# Patient Record
Sex: Female | Born: 1960 | Race: White | Hispanic: No | Marital: Married | Smoking: Never smoker
Health system: Southern US, Community
[De-identification: ages and names within clinical notes are randomized; demographics above are authoritative.]

## PROBLEM LIST (undated history)

## (undated) DIAGNOSIS — R921 Mammographic calcification found on diagnostic imaging of breast: Secondary | ICD-10-CM

## (undated) DIAGNOSIS — J309 Allergic rhinitis, unspecified: Secondary | ICD-10-CM

## (undated) DIAGNOSIS — I10 Essential (primary) hypertension: Secondary | ICD-10-CM

## (undated) DIAGNOSIS — B019 Varicella without complication: Secondary | ICD-10-CM

## (undated) DIAGNOSIS — K219 Gastro-esophageal reflux disease without esophagitis: Secondary | ICD-10-CM

## (undated) DIAGNOSIS — T7840XA Allergy, unspecified, initial encounter: Secondary | ICD-10-CM

## (undated) DIAGNOSIS — U071 COVID-19: Secondary | ICD-10-CM

## (undated) HISTORY — PX: OOPHORECTOMY: SHX86

## (undated) HISTORY — DX: Allergic rhinitis, unspecified: J30.9

## (undated) HISTORY — DX: Essential (primary) hypertension: I10

## (undated) HISTORY — DX: Gastro-esophageal reflux disease without esophagitis: K21.9

## (undated) HISTORY — DX: COVID-19: U07.1

## (undated) HISTORY — DX: Varicella without complication: B01.9

## (undated) HISTORY — DX: Mammographic calcification found on diagnostic imaging of breast: R92.1

## (undated) HISTORY — PX: TONSILLECTOMY: SUR1361

## (undated) HISTORY — DX: Allergy, unspecified, initial encounter: T78.40XA

---

## 2005-04-28 ENCOUNTER — Ambulatory Visit: Payer: Self-pay | Admitting: Family Medicine

## 2005-07-23 ENCOUNTER — Ambulatory Visit: Payer: Self-pay | Admitting: Unknown Physician Specialty

## 2005-08-07 ENCOUNTER — Ambulatory Visit: Payer: Self-pay | Admitting: Unknown Physician Specialty

## 2007-04-21 ENCOUNTER — Ambulatory Visit: Payer: Self-pay | Admitting: Unknown Physician Specialty

## 2007-08-04 HISTORY — PX: ABDOMINAL HYSTERECTOMY: SHX81

## 2007-08-29 ENCOUNTER — Ambulatory Visit: Payer: Self-pay | Admitting: Family Medicine

## 2007-09-06 ENCOUNTER — Ambulatory Visit: Payer: Self-pay | Admitting: Gynecologic Oncology

## 2007-10-06 ENCOUNTER — Other Ambulatory Visit: Payer: Self-pay

## 2007-10-06 ENCOUNTER — Ambulatory Visit: Payer: Self-pay | Admitting: Unknown Physician Specialty

## 2007-10-11 ENCOUNTER — Inpatient Hospital Stay: Payer: Self-pay | Admitting: Unknown Physician Specialty

## 2009-02-28 ENCOUNTER — Ambulatory Visit: Payer: Self-pay | Admitting: Unknown Physician Specialty

## 2009-11-18 ENCOUNTER — Ambulatory Visit: Payer: Self-pay | Admitting: Family Medicine

## 2010-10-15 ENCOUNTER — Ambulatory Visit: Payer: Self-pay | Admitting: Family Medicine

## 2011-11-09 IMAGING — CT CT MAXILLOFACIAL WITH CONTRAST
1 series · 15 of 30 positions shown, 19 images · non-contrast
Comparison: none

REASON FOR EXAM: FACIAL SWELLING R PROTID GLAND SWOLLEN
COMMENTS:

PROCEDURE:     CT  - CT MAXILLOFACIAL AREA W  - November 18, 2009  [DATE]
RESULT:
TECHNIQUE: Helical 3 mm sections were obtained status post intravenous
administration of 75 mL [id number]. These were reconstructed utilizing a
soft tissue algorithm.

[Series 2: soft tissue · axial · 0.41mm/px · z∈[+232,+392]mm · 15 of 57 slices shown, 19 images]
[im 2/57  brain]
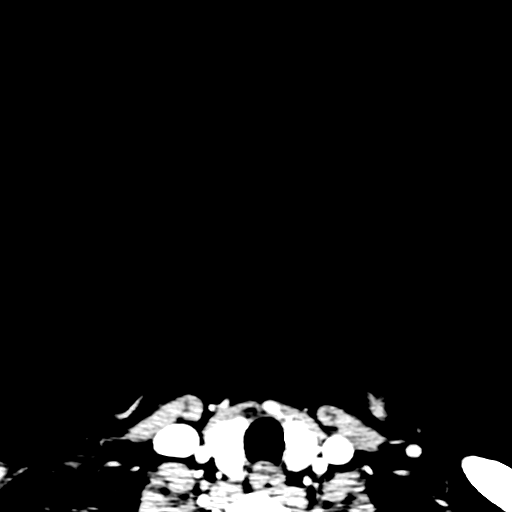
[im 2/57  bone]
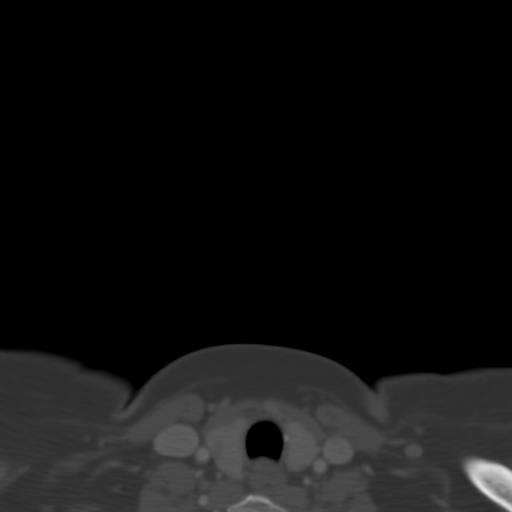
[im 6/57  bone]
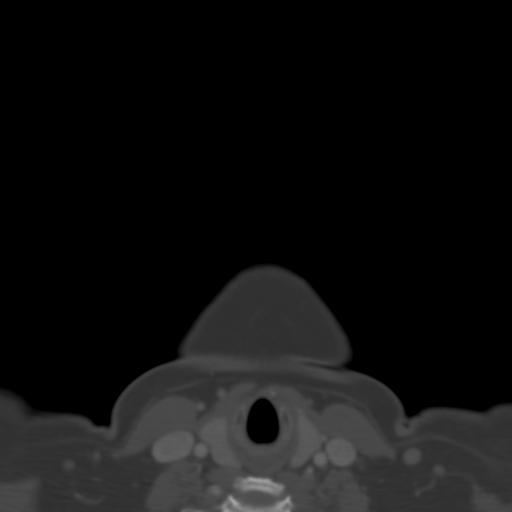
[im 10/57  bone]
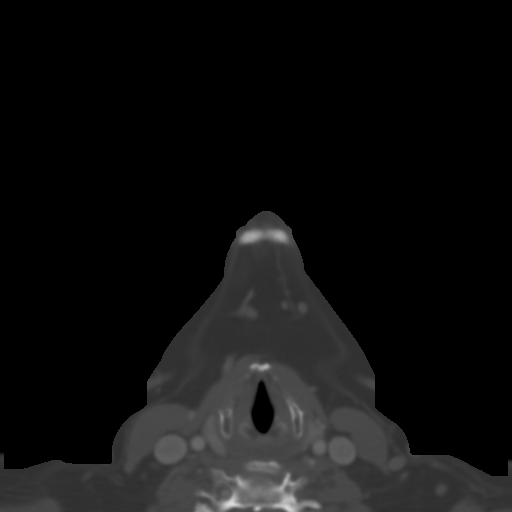
[im 14/57  bone]
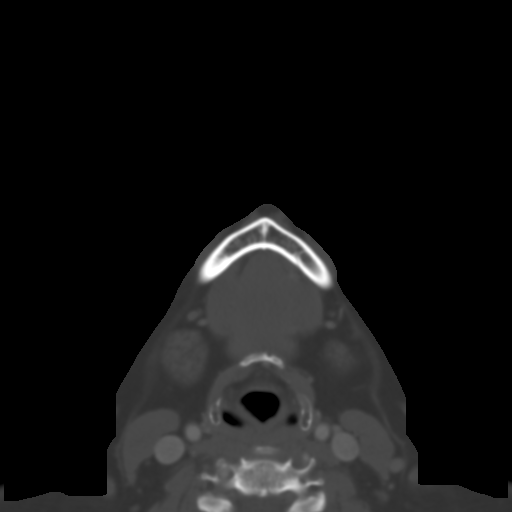
[im 18/57  brain]
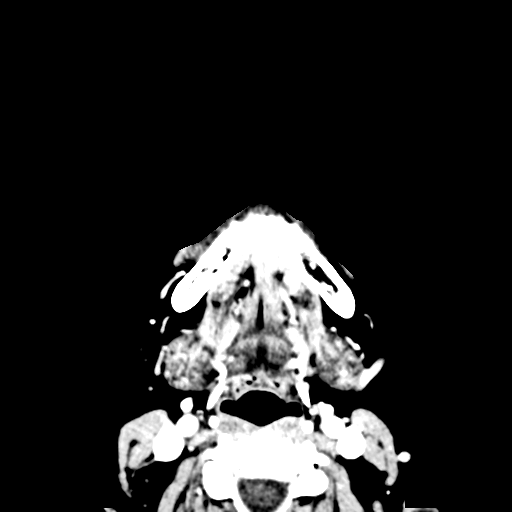
[im 18/57  bone]
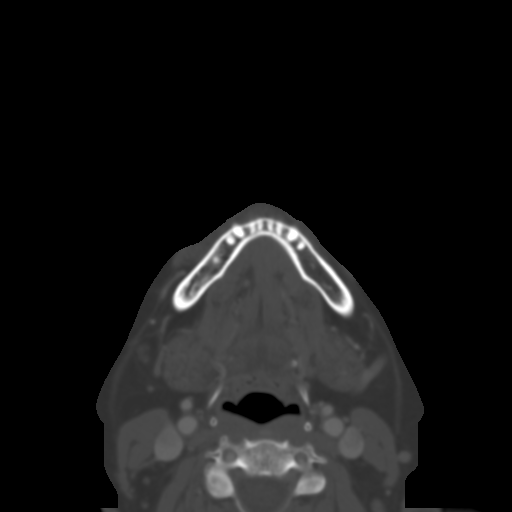
[im 20/57  bone]
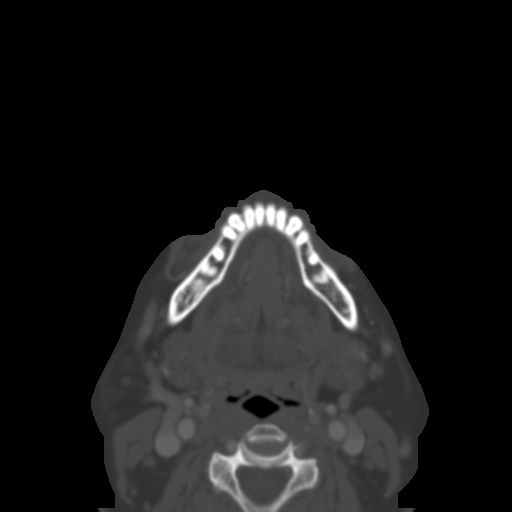
[im 24/57  bone]
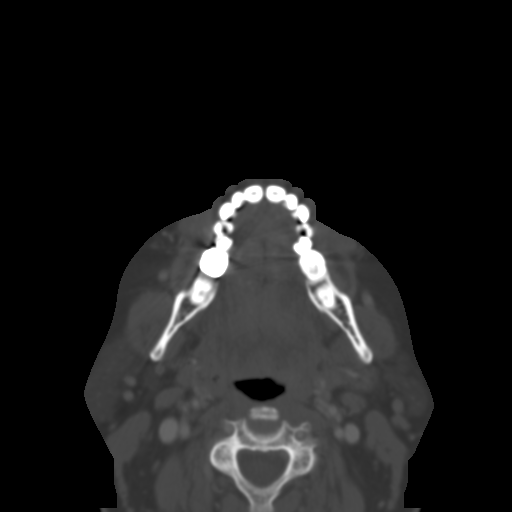
[im 29/57  bone]
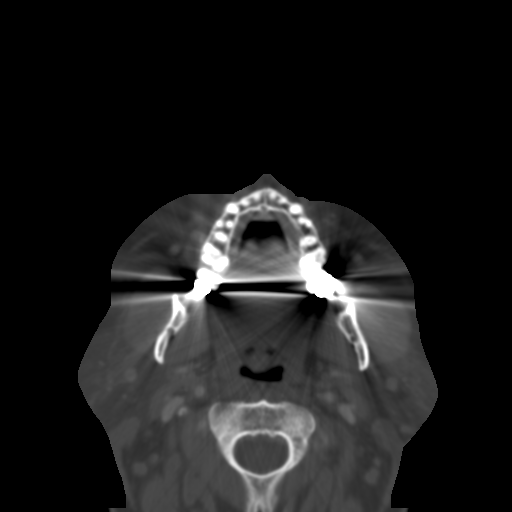
[im 33/57  brain]
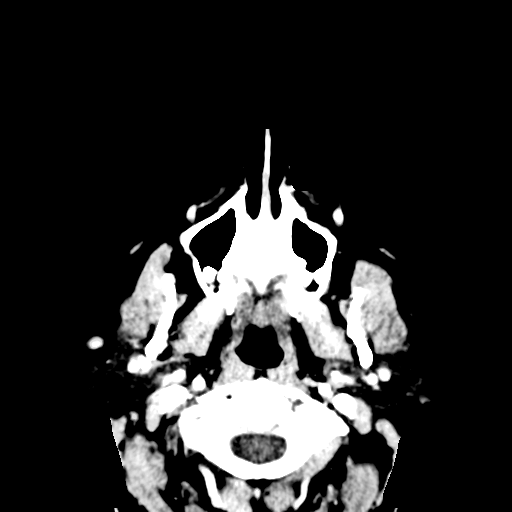
[im 33/57  bone]
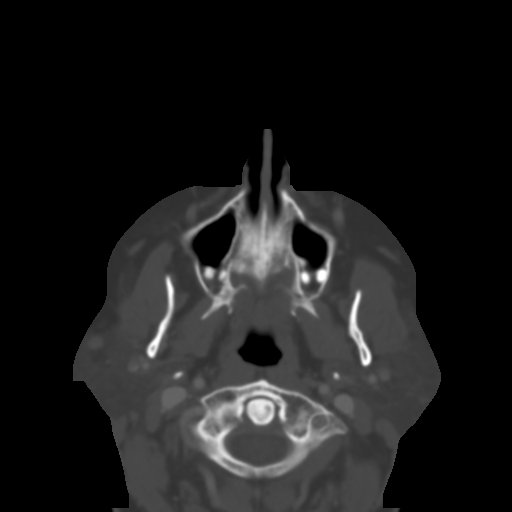
[im 37/57  bone]
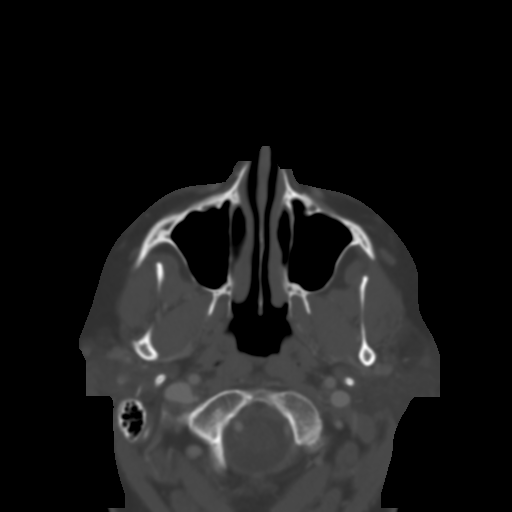
[im 39/57  bone]
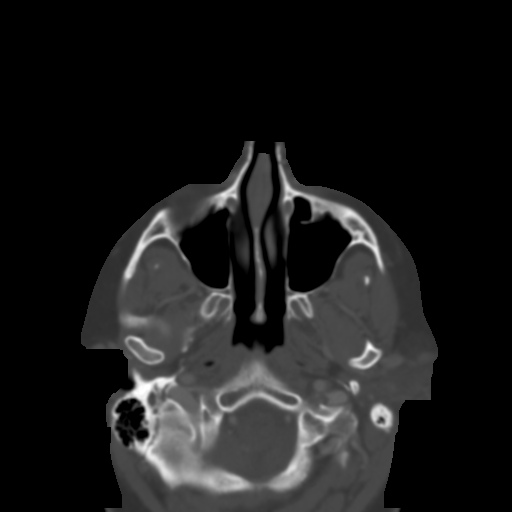
[im 43/57  bone]
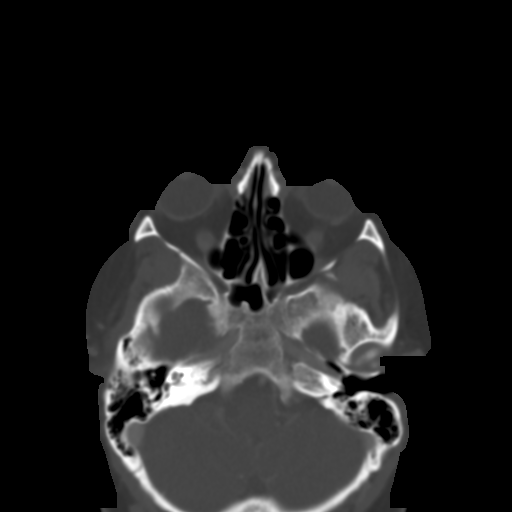
[im 47/57  brain]
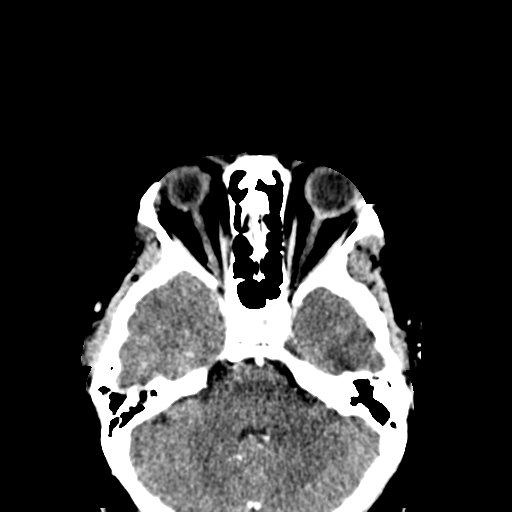
[im 47/57  bone]
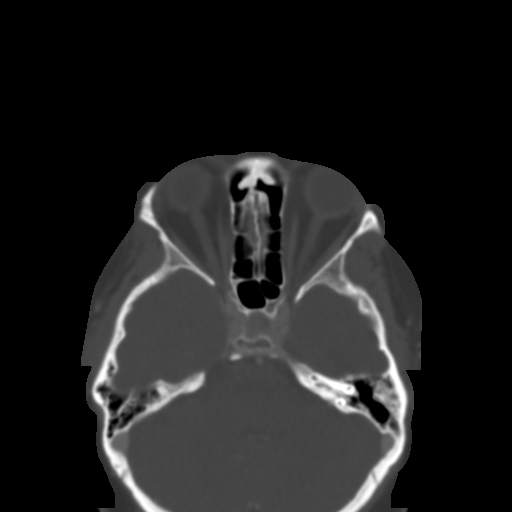
[im 51/57  bone]
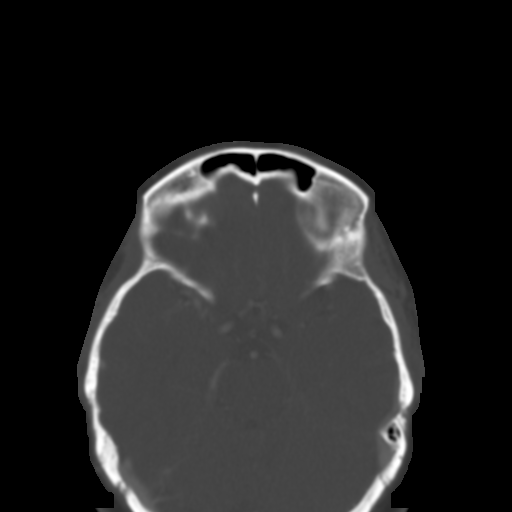
[im 55/57  bone]
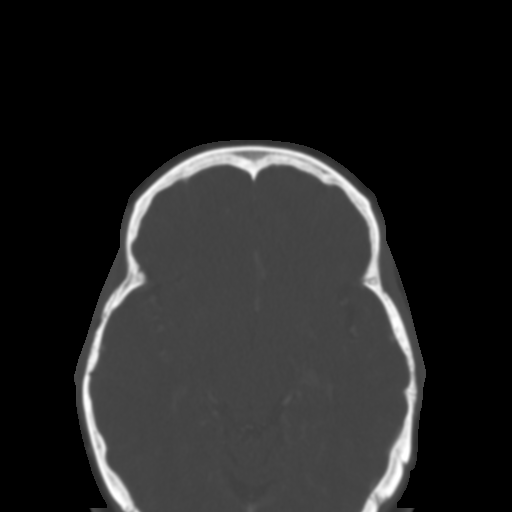

[15 of 30 positions shown; findings below may reference images not displayed]

FINDINGS: Evaluation of the right parotid in the region of palpable concern
demonstrates a small 6.4 cm enhancing nodule along the serosal surface. This
is an indeterminate appearing nodule. This is in the region of palpable
concern. A second nodule is identified along the anterior apex of the
parotid on the right measuring 7.1 millimeters. There is a hint of a
dicrotic notch within this nodule. No further masses or nodule is
identified. There is no evidence of free fluid or loculated fluid
collections. Opacified vascular structures are unremarkable. The paranasal
sinuses are patent. The skull base is unremarkable.
IMPRESSION: Very small indeterminate nodule in the region of palpable concern and a
second nodule along the anterior/superior border of the parotid. Further
evaluation with ENT consultation is recommended. Differential considerations
are parotid soft tissue nodules versus small parotid lymph nodes. The
benignity of these nodules cannot be determined from this study.

Thank you for the opportunity to contribute to the care of your patient.

ADDENDUM:  12/04/09- First sentence in Findings should read: Evaluation of the
right parotid in the region of palpable concern demonstrates a small 6.4 mm
enhancing nodule along the serosal surface.

## 2012-10-05 IMAGING — MG MAM DGTL SCREENING MAMMO W/CAD
1 series · 4 of 4 positions shown · non-contrast
Comparison: none

REASON FOR EXAM: SCREENING
COMMENTS:  Submitted by practice: [facility] Scheduled by user:
Misghina Azarcon

[R CC · right · 4 of 4 slices shown]
[im 1/4]
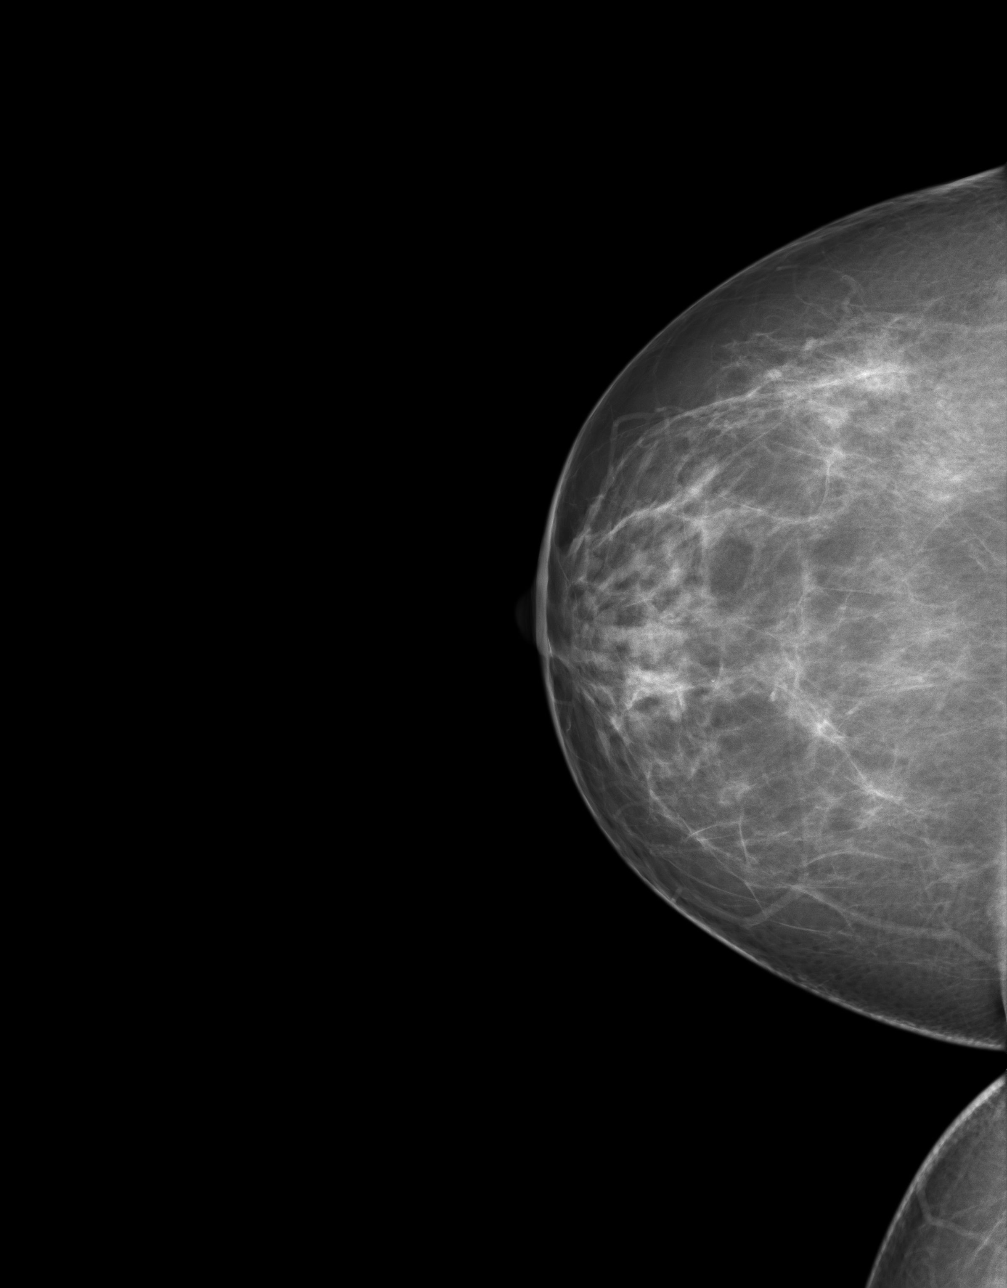
[im 2/4]
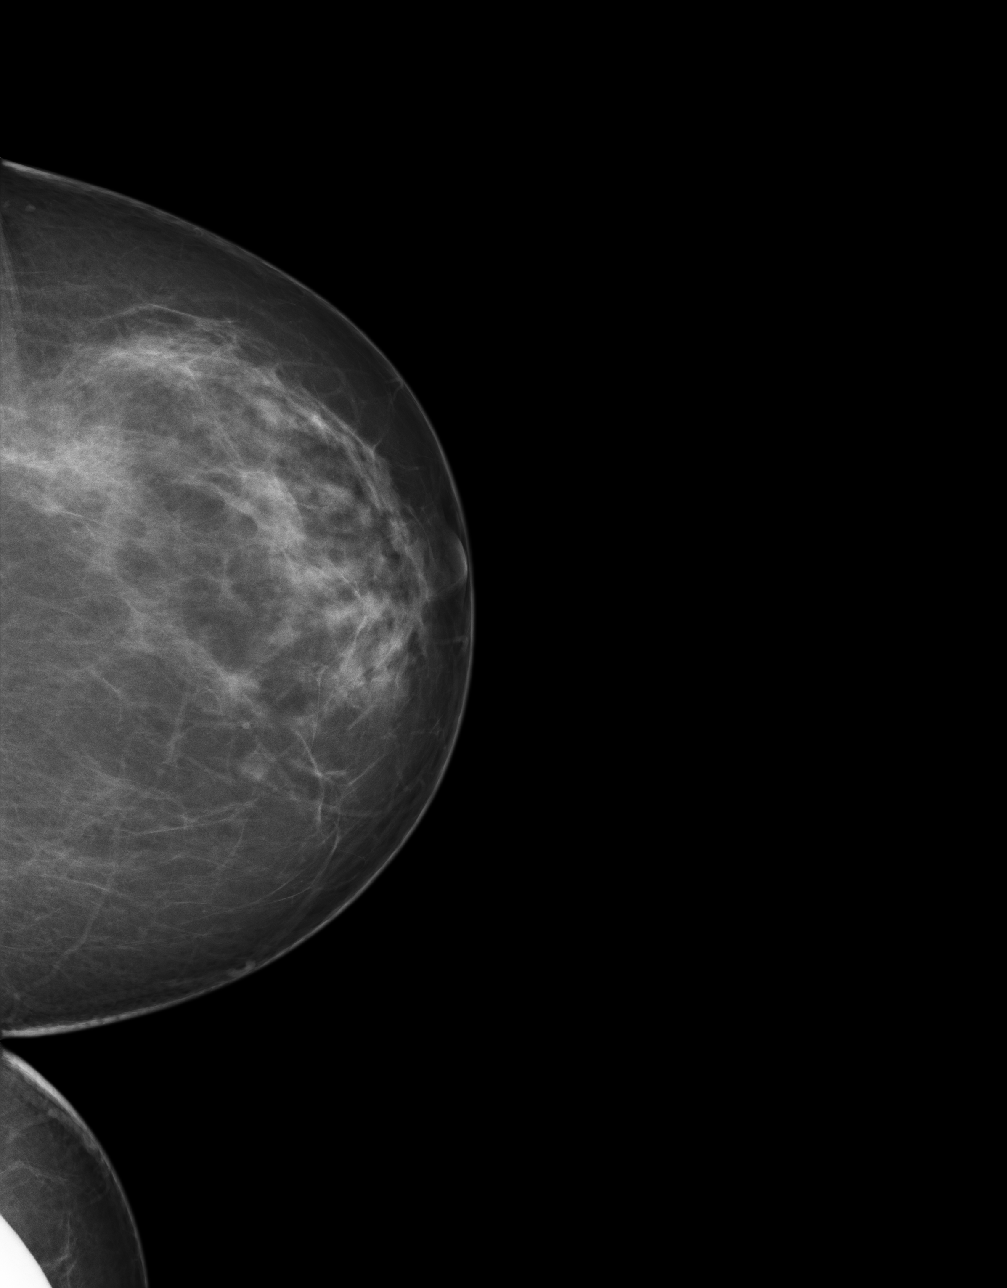
[im 3/4]
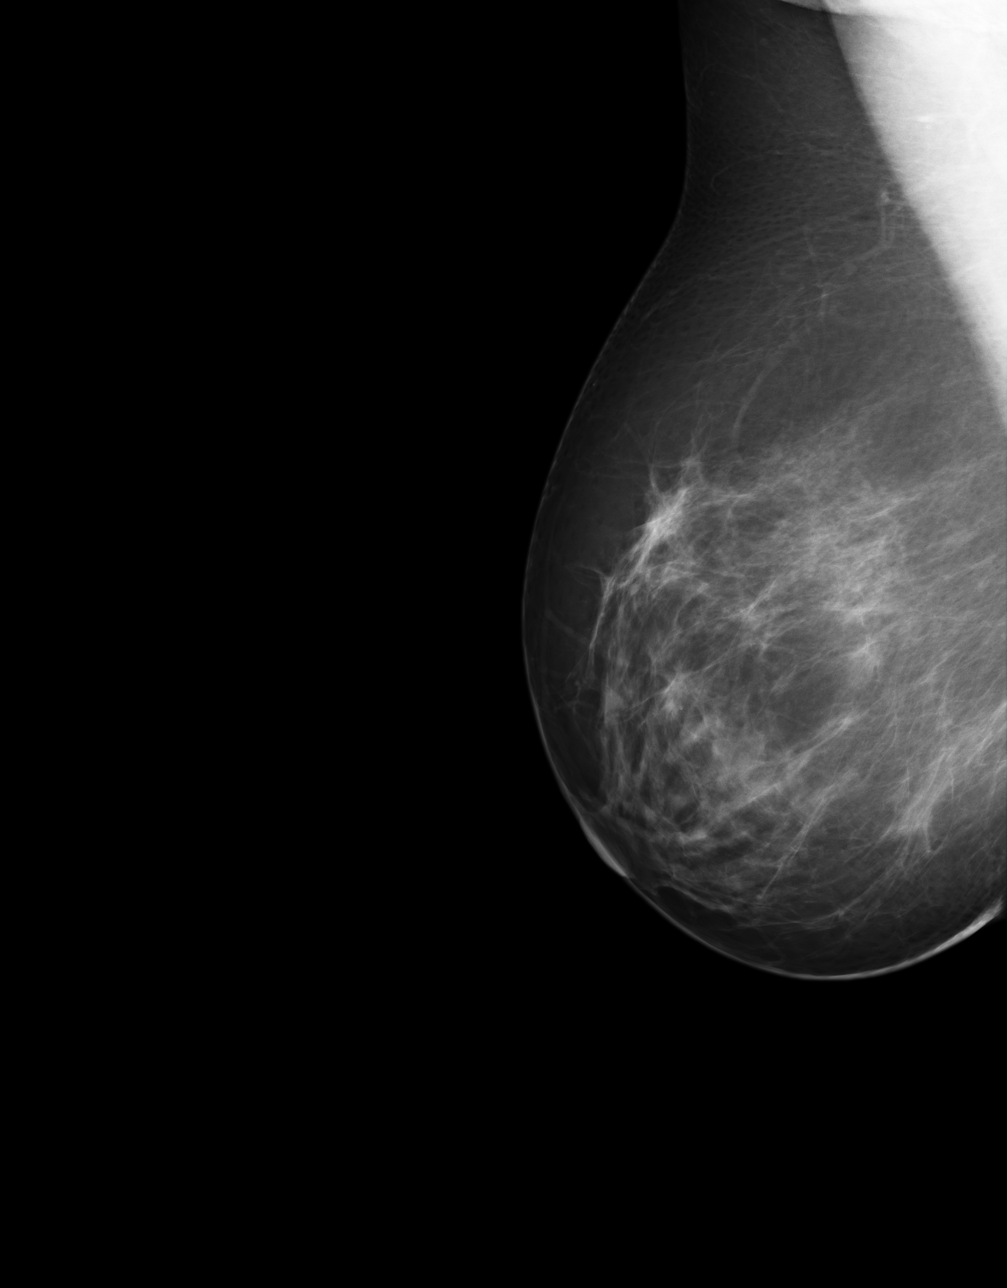
[im 4/4]
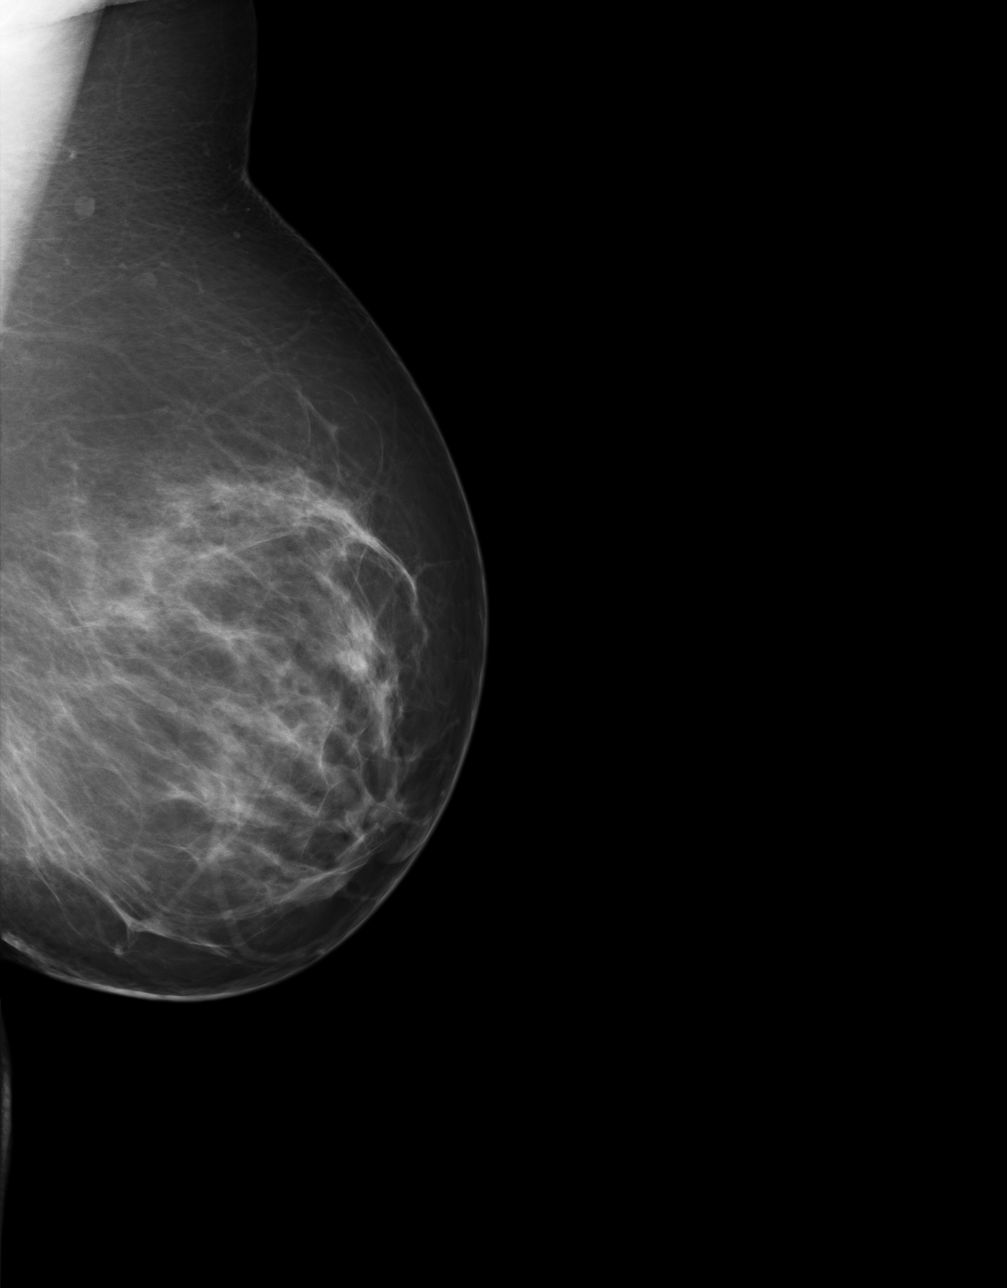

[4 of 4 positions shown; findings below may reference images not displayed]

PROCEDURE:     MAM - MAM DGTL SCREENING MAMMO W/CAD  - October 15, 2010  [DATE]

RESULT:     Comparison is made to prior examinations February 28, 2009,April 21, 2007 and June 20, 2002. There are scattered fibroglandular
densities bilaterally. No dominant mass or malignant-appearing
microcalcifications are seen.
IMPRESSION: 1. Bilaterally benign-appearing screening mammography.
2. Annual screening mammography is recommended.
3. BI-RADS: Category 1-Negative.

A negative mammogram report does not preclude biopsy or other evaluation of
a clinically palpable or otherwise suspicious mass or lesion.  Breast cancer
may not be detected by mammography in up to 10% of cases.

## 2012-11-16 ENCOUNTER — Ambulatory Visit: Payer: Self-pay | Admitting: Family Medicine

## 2014-10-13 LAB — LIPID PANEL
Cholesterol: 165 mg/dL (ref 0–200)
HDL: 58 mg/dL (ref 35–70)
LDL Cholesterol: 93 mg/dL
TRIGLYCERIDES: 70 mg/dL (ref 40–160)

## 2014-10-13 LAB — BASIC METABOLIC PANEL
BUN: 11 mg/dL (ref 4–21)
Creatinine: 0.9 mg/dL (ref 0.5–1.1)
GLUCOSE: 90 mg/dL
POTASSIUM: 4.5 mmol/L (ref 3.4–5.3)
SODIUM: 140 mmol/L (ref 137–147)

## 2014-10-13 LAB — TSH: TSH: 3.76 u[IU]/mL (ref 0.41–5.90)

## 2014-10-13 LAB — CBC AND DIFFERENTIAL
HEMATOCRIT: 42 % (ref 36–46)
HEMOGLOBIN: 14 g/dL (ref 12.0–16.0)
NEUTROS ABS: 2 /uL
Platelets: 204 10*3/uL (ref 150–399)
WBC: 4.1 10^3/mL

## 2014-10-13 LAB — HEPATIC FUNCTION PANEL
ALK PHOS: 84 U/L (ref 25–125)
ALT: 19 U/L (ref 7–35)
AST: 27 U/L (ref 13–35)
Bilirubin, Total: 0.5 mg/dL

## 2014-11-07 IMAGING — MG MAM DGTL SCRN MAM NO ORDER W/CAD
1 series · 5 of 5 positions shown · non-contrast
Comparison: none

REASON FOR EXAM: SCR MAMMO NO ORDER
COMMENTS:

[R CC · right · 5 of 5 slices shown]
[im 1/5]
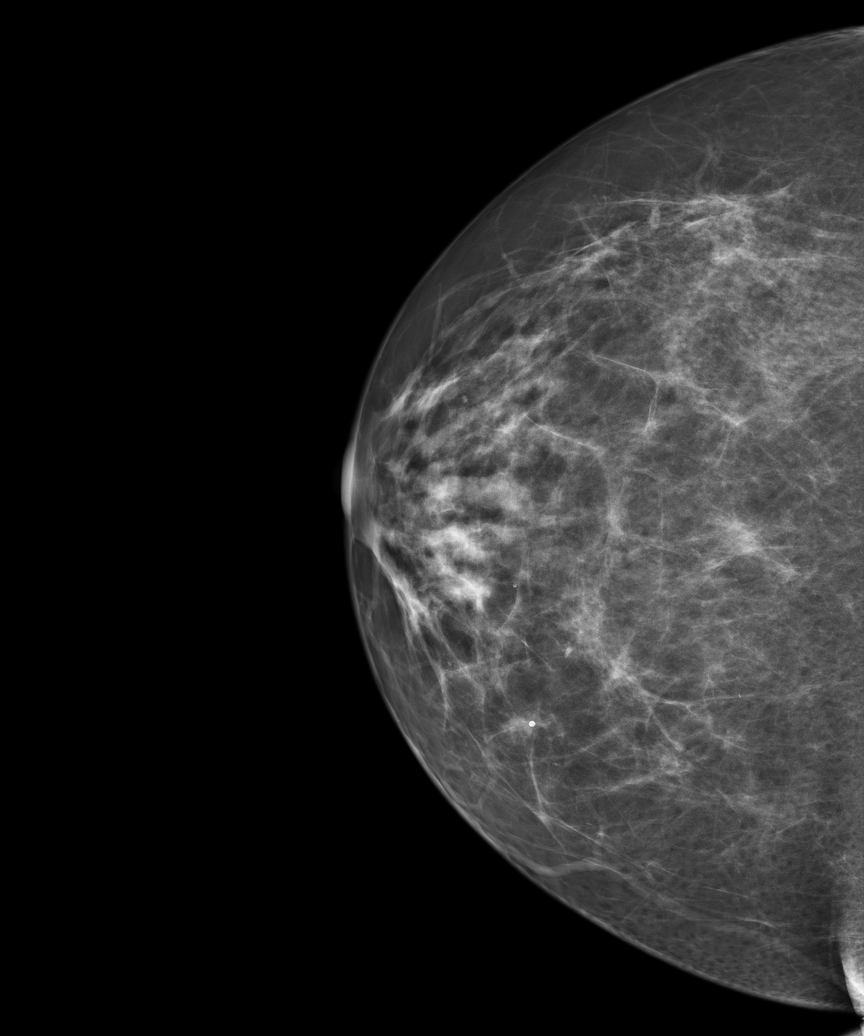
[im 2/5]
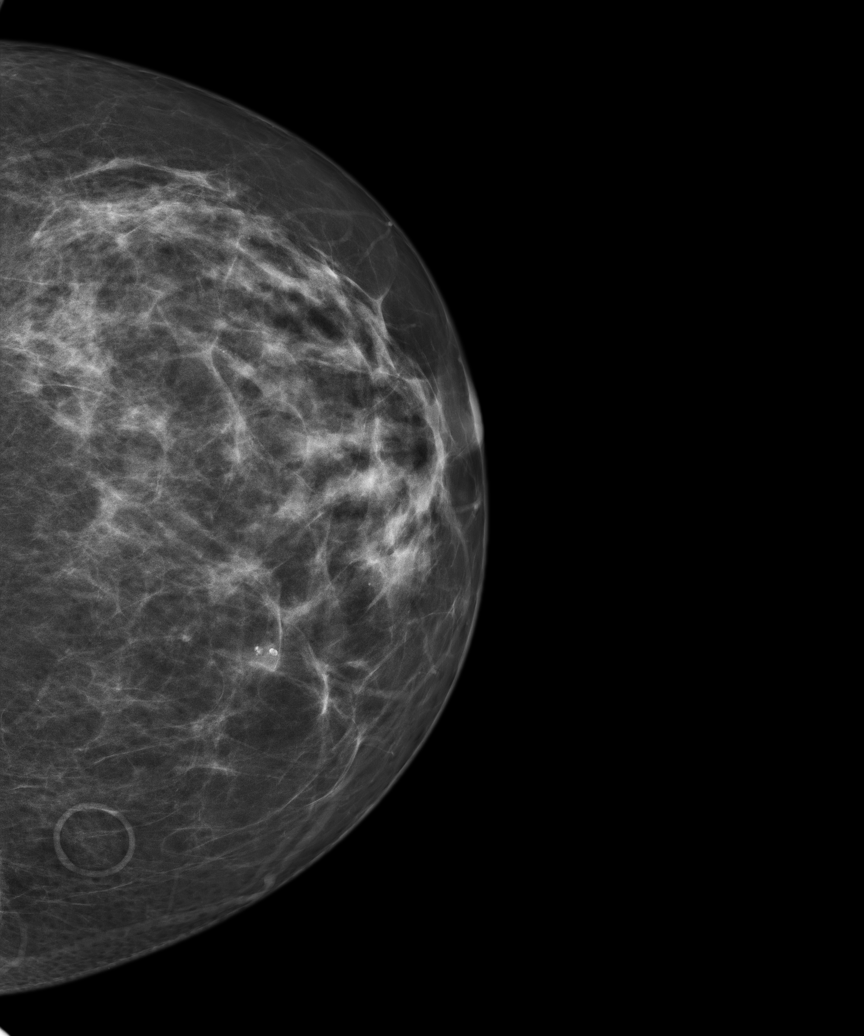
[im 3/5]
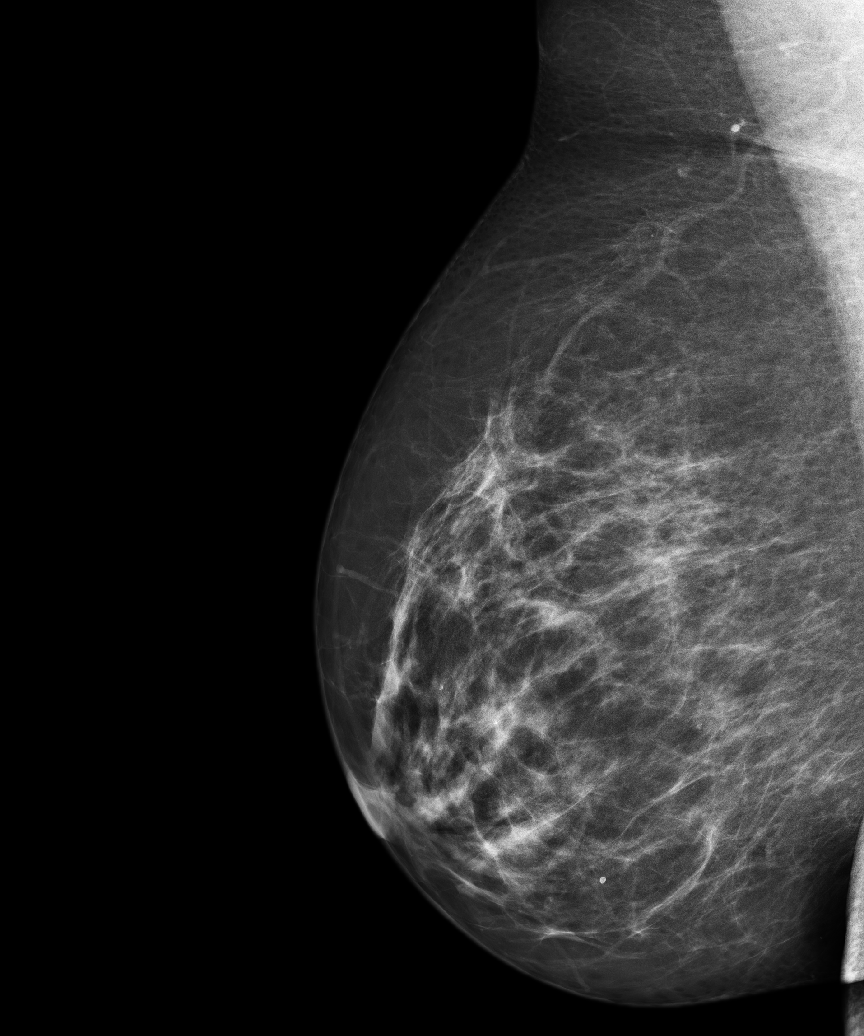
[im 4/5]
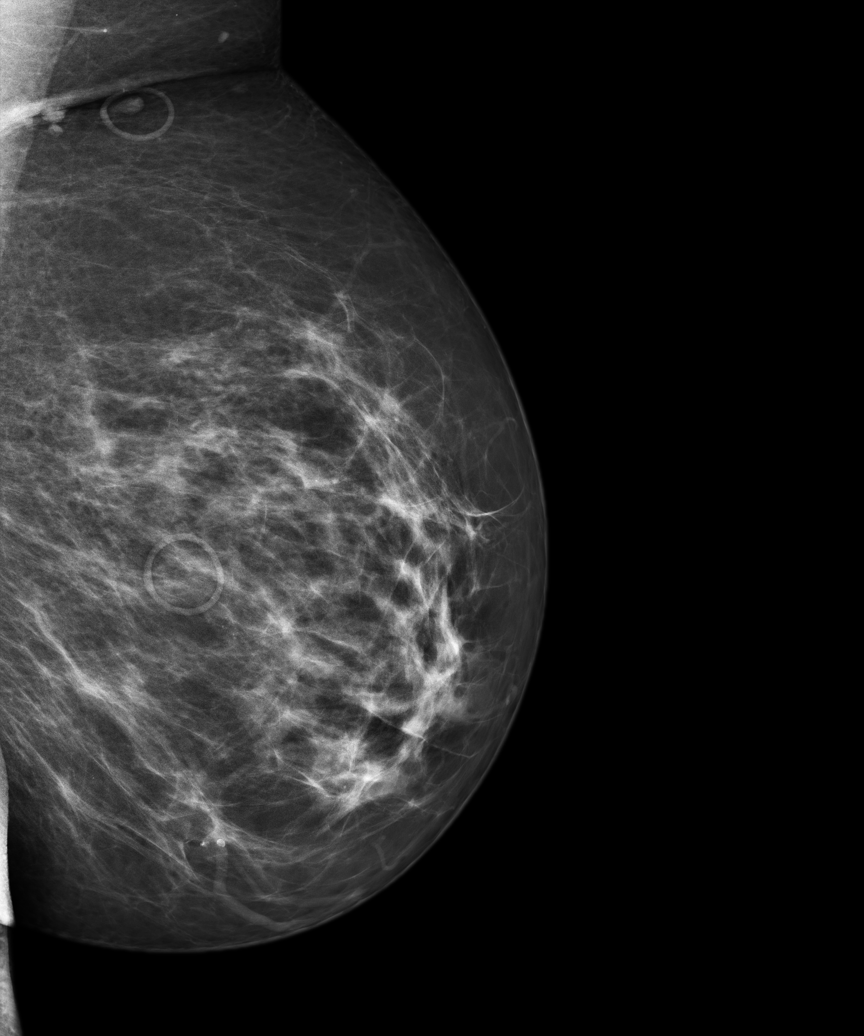
[im 5/5]
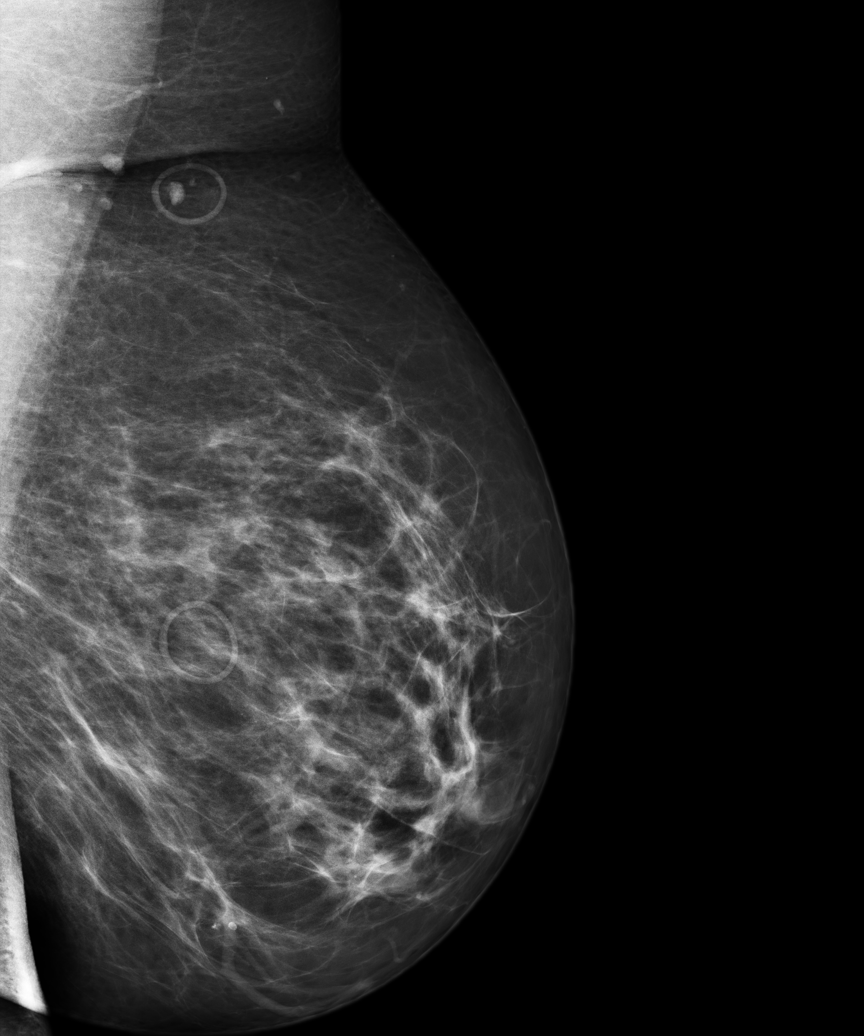

[5 of 5 positions shown; findings below may reference images not displayed]

PROCEDURE:     MAM - MAM DGTL SCRN MAM NO ORDER W/CAD  - November 16, 2012  [DATE]

RESULT:     There is no personal or family history of breast cancer. The
patient denies previous breast surgery. Comparison is made to the digital
images dated [DATE] [DATE] [DATE], [DATE] [DATE] [DATE] and 17 July, 2003. There is a
heterogeneous pattern of parenchymal density with no developing or dominant
mass or malignant calcification. Skin nevus markers are present over the
left breast. The mammographic appearance is stable.
IMPRESSION: Stable, benign appearing bilateral mammogram. Please
continue to encourage annual mammographic followup and monthly breast self
exam. BREAST COMPOSITION: The breast composition is HETEROGENEOUSLY DENSE
(glandular tissue is 51-75%) This may decrease the sensitivity of
mammography.  BI-RADS: Category 2- Benign Finding

A NEGATIVE MAMMOGRAM REPORT DOES NOT PRECLUDE BIOPSY OR OTHER EVALUATION OF
A CLINICALLY PALPABLE OR OTHERWISE SUSPICIOUS MASS OR LESION. BREAST CANCER
MAY NOT BE DETECTED IN UP TO 10% OF CASES.

[REDACTED]

## 2015-10-21 ENCOUNTER — Encounter: Payer: Self-pay | Admitting: Family Medicine

## 2015-10-21 ENCOUNTER — Ambulatory Visit (INDEPENDENT_AMBULATORY_CARE_PROVIDER_SITE_OTHER): Payer: BLUE CROSS/BLUE SHIELD | Admitting: Family Medicine

## 2015-10-21 VITALS — BP 146/82 | HR 72 | Temp 98.0°F | Ht 63.0 in | Wt 188.5 lb

## 2015-10-21 DIAGNOSIS — Z130 Encounter for screening for diseases of the blood and blood-forming organs and certain disorders involving the immune mechanism: Secondary | ICD-10-CM

## 2015-10-21 DIAGNOSIS — Z1159 Encounter for screening for other viral diseases: Secondary | ICD-10-CM

## 2015-10-21 DIAGNOSIS — I10 Essential (primary) hypertension: Secondary | ICD-10-CM | POA: Diagnosis not present

## 2015-10-21 DIAGNOSIS — E669 Obesity, unspecified: Secondary | ICD-10-CM

## 2015-10-21 DIAGNOSIS — Z0000 Encounter for general adult medical examination without abnormal findings: Secondary | ICD-10-CM | POA: Insufficient documentation

## 2015-10-21 DIAGNOSIS — Z1211 Encounter for screening for malignant neoplasm of colon: Secondary | ICD-10-CM

## 2015-10-21 DIAGNOSIS — Z13220 Encounter for screening for lipoid disorders: Secondary | ICD-10-CM

## 2015-10-21 LAB — LIPID PANEL
CHOL/HDL RATIO: 3
Cholesterol: 161 mg/dL (ref 0–200)
HDL: 47.8 mg/dL (ref >39.00)
LDL Cholesterol: 84 mg/dL (ref 0–99)
NONHDL: 112.93
Triglycerides: 144 mg/dL (ref 0.0–149.0)
VLDL: 28.8 mg/dL (ref 0.0–40.0)

## 2015-10-21 LAB — COMPREHENSIVE METABOLIC PANEL
ALK PHOS: 69 U/L (ref 39–117)
ALT: 19 U/L (ref 0–35)
AST: 21 U/L (ref 0–37)
Albumin: 4.1 g/dL (ref 3.5–5.2)
BILIRUBIN TOTAL: 0.4 mg/dL (ref 0.2–1.2)
BUN: 17 mg/dL (ref 6–23)
CO2: 26 meq/L (ref 19–32)
CREATININE: 0.84 mg/dL (ref 0.40–1.20)
Calcium: 9.8 mg/dL (ref 8.4–10.5)
Chloride: 104 mEq/L (ref 96–112)
GFR: 74.55 mL/min (ref >60.00)
GLUCOSE: 93 mg/dL (ref 70–99)
Potassium: 3.9 mEq/L (ref 3.5–5.1)
Sodium: 138 mEq/L (ref 135–145)
TOTAL PROTEIN: 7.3 g/dL (ref 6.0–8.3)

## 2015-10-21 LAB — CBC
HCT: 41 % (ref 36.0–46.0)
HEMOGLOBIN: 13.8 g/dL (ref 12.0–15.0)
MCHC: 33.7 g/dL (ref 30.0–36.0)
MCV: 87.2 fl (ref 78.0–100.0)
PLATELETS: 221 10*3/uL (ref 150.0–400.0)
RBC: 4.71 Mil/uL (ref 3.87–5.11)
RDW: 12.8 % (ref 11.5–15.5)
WBC: 5.7 10*3/uL (ref 4.0–10.5)

## 2015-10-21 LAB — HEMOGLOBIN A1C: HEMOGLOBIN A1C: 5.5 % (ref 4.6–6.5)

## 2015-10-21 MED ORDER — LOSARTAN POTASSIUM 50 MG PO TABS: 50.0000 mg | ORAL_TABLET | Freq: Every day | ORAL | Status: DC

## 2015-10-21 NOTE — Patient Instructions (Signed)
I have refilled your BP.  Follow up in 6 months to 1 year.  Be sure to get your mammogram.  We we'll be in contact regarding colonoscopy.  Take care  Dr. Lacinda Axon  Health Maintenance, Female Adopting a healthy lifestyle and getting preventive care can go a long way to promote health and wellness. Talk with your health care provider about what schedule of regular examinations is right for you. This is a good chance for you to check in with your provider about disease prevention and staying healthy. In between checkups, there are plenty of things you can do on your own. Experts have done a lot of research about which lifestyle changes and preventive measures are most likely to keep you healthy. Ask your health care provider for more information. WEIGHT AND DIET  Eat a healthy diet  Be sure to include plenty of vegetables, fruits, low-fat dairy products, and lean protein.  Do not eat a lot of foods high in solid fats, added sugars, or salt.  Get regular exercise. This is one of the most important things you can do for your health.  Most adults should exercise for at least 150 minutes each week. The exercise should increase your heart rate and make you sweat (moderate-intensity exercise).  Most adults should also do strengthening exercises at least twice a week. This is in addition to the moderate-intensity exercise.  Maintain a healthy weight  Body mass index (BMI) is a measurement that can be used to identify possible weight problems. It estimates body fat based on height and weight. Your health care provider can help determine your BMI and help you achieve or maintain a healthy weight.  For females 21 years of age and older:   A BMI below 18.5 is considered underweight.  A BMI of 18.5 to 24.9 is normal.  A BMI of 25 to 29.9 is considered overweight.  A BMI of 30 and above is considered obese.  Watch levels of cholesterol and blood lipids  You should start having your blood  tested for lipids and cholesterol at 56 years of age, then have this test every 5 years.  You may need to have your cholesterol levels checked more often if:  Your lipid or cholesterol levels are high.  You are older than 56 years of age.  You are at high risk for heart disease.  CANCER SCREENING   Lung Cancer  Lung cancer screening is recommended for adults 56-7 years old who are at high risk for lung cancer because of a history of smoking.  A yearly low-dose CT scan of the lungs is recommended for people who:  Currently smoke.  Have quit within the past 15 years.  Have at least a 30-pack-year history of smoking. A pack year is smoking an average of one pack of cigarettes a day for 1 year.  Yearly screening should continue until it has been 15 years since you quit.  Yearly screening should stop if you develop a health problem that would prevent you from having lung cancer treatment.  Breast Cancer  Practice breast self-awareness. This means understanding how your breasts normally appear and feel.  It also means doing regular breast self-exams. Let your health care provider know about any changes, no matter how small.  If you are in your 20s or 30s, you should have a clinical breast exam (CBE) by a health care provider every 1-3 years as part of a regular health exam.  If you are 40 or older,  have a CBE every year. Also consider having a breast X-ray (mammogram) every year.  If you have a family history of breast cancer, talk to your health care provider about genetic screening.  If you are at high risk for breast cancer, talk to your health care provider about having an MRI and a mammogram every year.  Breast cancer gene (BRCA) assessment is recommended for women who have family members with BRCA-related cancers. BRCA-related cancers include:  Breast.  Ovarian.  Tubal.  Peritoneal cancers.  Results of the assessment will determine the need for genetic counseling  and BRCA1 and BRCA2 testing. Cervical Cancer Your health care provider may recommend that you be screened regularly for cancer of the pelvic organs (ovaries, uterus, and vagina). This screening involves a pelvic examination, including checking for microscopic changes to the surface of your cervix (Pap test). You may be encouraged to have this screening done every 3 years, beginning at age 1.  For women ages 70-65, health care providers may recommend pelvic exams and Pap testing every 3 years, or they may recommend the Pap and pelvic exam, combined with testing for human papilloma virus (HPV), every 5 years. Some types of HPV increase your risk of cervical cancer. Testing for HPV may also be done on women of any age with unclear Pap test results.  Other health care providers may not recommend any screening for nonpregnant women who are considered low risk for pelvic cancer and who do not have symptoms. Ask your health care provider if a screening pelvic exam is right for you.  If you have had past treatment for cervical cancer or a condition that could lead to cancer, you need Pap tests and screening for cancer for at least 20 years after your treatment. If Pap tests have been discontinued, your risk factors (such as having a new sexual partner) need to be reassessed to determine if screening should resume. Some women have medical problems that increase the chance of getting cervical cancer. In these cases, your health care provider may recommend more frequent screening and Pap tests. Colorectal Cancer  This type of cancer can be detected and often prevented.  Routine colorectal cancer screening usually begins at 56 years of age and continues through 56 years of age.  Your health care provider may recommend screening at an earlier age if you have risk factors for colon cancer.  Your health care provider may also recommend using home test kits to check for hidden blood in the stool.  A small camera  at the end of a tube can be used to examine your colon directly (sigmoidoscopy or colonoscopy). This is done to check for the earliest forms of colorectal cancer.  Routine screening usually begins at age 80.  Direct examination of the colon should be repeated every 5-10 years through 56 years of age. However, you may need to be screened more often if early forms of precancerous polyps or small growths are found. Skin Cancer  Check your skin from head to toe regularly.  Tell your health care provider about any new moles or changes in moles, especially if there is a change in a mole's shape or color.  Also tell your health care provider if you have a mole that is larger than the size of a pencil eraser.  Always use sunscreen. Apply sunscreen liberally and repeatedly throughout the day.  Protect yourself by wearing long sleeves, pants, a wide-brimmed hat, and sunglasses whenever you are outside. HEART DISEASE, DIABETES, AND  HIGH BLOOD PRESSURE   High blood pressure causes heart disease and increases the risk of stroke. High blood pressure is more likely to develop in:  People who have blood pressure in the high end of the normal range (130-139/85-89 mm Hg).  People who are overweight or obese.  People who are African American.  If you are 18-39 years of age, have your blood pressure checked every 3-5 years. If you are 40 years of age or older, have your blood pressure checked every year. You should have your blood pressure measured twice--once when you are at a hospital or clinic, and once when you are not at a hospital or clinic. Record the average of the two measurements. To check your blood pressure when you are not at a hospital or clinic, you can use:  An automated blood pressure machine at a pharmacy.  A home blood pressure monitor.  If you are between 55 years and 79 years old, ask your health care provider if you should take aspirin to prevent strokes.  Have regular diabetes  screenings. This involves taking a blood sample to check your fasting blood sugar level.  If you are at a normal weight and have a low risk for diabetes, have this test once every three years after 56 years of age.  If you are overweight and have a high risk for diabetes, consider being tested at a younger age or more often. PREVENTING INFECTION  Hepatitis B  If you have a higher risk for hepatitis B, you should be screened for this virus. You are considered at high risk for hepatitis B if:  You were born in a country where hepatitis B is common. Ask your health care provider which countries are considered high risk.  Your parents were born in a high-risk country, and you have not been immunized against hepatitis B (hepatitis B vaccine).  You have HIV or AIDS.  You use needles to inject street drugs.  You live with someone who has hepatitis B.  You have had sex with someone who has hepatitis B.  You get hemodialysis treatment.  You take certain medicines for conditions, including cancer, organ transplantation, and autoimmune conditions. Hepatitis C  Blood testing is recommended for:  Everyone born from 1945 through 1965.  Anyone with known risk factors for hepatitis C. Sexually transmitted infections (STIs)  You should be screened for sexually transmitted infections (STIs) including gonorrhea and chlamydia if:  You are sexually active and are younger than 56 years of age.  You are older than 56 years of age and your health care provider tells you that you are at risk for this type of infection.  Your sexual activity has changed since you were last screened and you are at an increased risk for chlamydia or gonorrhea. Ask your health care provider if you are at risk.  If you do not have HIV, but are at risk, it may be recommended that you take a prescription medicine daily to prevent HIV infection. This is called pre-exposure prophylaxis (PrEP). You are considered at risk  if:  You are sexually active and do not regularly use condoms or know the HIV status of your partner(s).  You take drugs by injection.  You are sexually active with a partner who has HIV. Talk with your health care provider about whether you are at high risk of being infected with HIV. If you choose to begin PrEP, you should first be tested for HIV. You should then be tested   every 3 months for as long as you are taking PrEP.  PREGNANCY   If you are premenopausal and you may become pregnant, ask your health care provider about preconception counseling.  If you may become pregnant, take 400 to 800 micrograms (mcg) of folic acid every day.  If you want to prevent pregnancy, talk to your health care provider about birth control (contraception). OSTEOPOROSIS AND MENOPAUSE   Osteoporosis is a disease in which the bones lose minerals and strength with aging. This can result in serious bone fractures. Your risk for osteoporosis can be identified using a bone density scan.  If you are 65 years of age or older, or if you are at risk for osteoporosis and fractures, ask your health care provider if you should be screened.  Ask your health care provider whether you should take a calcium or vitamin D supplement to lower your risk for osteoporosis.  Menopause may have certain physical symptoms and risks.  Hormone replacement therapy may reduce some of these symptoms and risks. Talk to your health care provider about whether hormone replacement therapy is right for you.  HOME CARE INSTRUCTIONS   Schedule regular health, dental, and eye exams.  Stay current with your immunizations.   Do not use any tobacco products including cigarettes, chewing tobacco, or electronic cigarettes.  If you are pregnant, do not drink alcohol.  If you are breastfeeding, limit how much and how often you drink alcohol.  Limit alcohol intake to no more than 1 drink per day for nonpregnant women. One drink equals 12  ounces of beer, 5 ounces of wine, or 1 ounces of hard liquor.  Do not use street drugs.  Do not share needles.  Ask your health care provider for help if you need support or information about quitting drugs.  Tell your health care provider if you often feel depressed.  Tell your health care provider if you have ever been abused or do not feel safe at home.   This information is not intended to replace advice given to you by your health care provider. Make sure you discuss any questions you have with your health care provider.   Document Released: 02/02/2011 Document Revised: 08/10/2014 Document Reviewed: 06/21/2013 Elsevier Interactive Patient Education 2016 [employer]  

## 2015-10-21 NOTE — Progress Notes (Signed)
Subjective:  Patient ID: Margaret Carroll, female    DOB: [birth year]  Age: 55 y.o. MRN: [id number]  CC: Establish care; HTN  HPI Margaret Carroll is a 55 y.o. female presents to the clinic today to establish care.   Preventative Healthcare  Pap smear: Up to date. No longer needed as patient has had hysterectomy for benign reasons.  Mammogram: In need of. Patient states that she is going to call schedule her mammogram.  Colonoscopy: In need of.  Immunizations  Tetanus - Up to date.   Pneumococcal - Not indicated.   Flu - Declined.   Zoster - Not indicated.   Hepatitis C screening - In need of.   Labs: Screening labs today.   Exercise: Yes exercises regularly.   Alcohol use: No.   Smoking/tobacco use: No.  Wears seat belt: yes.   HTN  Patient reports that her blood pressure has been stable.  She is currently taking losartan 50 mg daily.  She's not had labs in a year.  She is in need of refill as well as labs today.  PMH, Surgical Hx, Family Hx, Social History reviewed and updated as below.  Past Medical History  Diagnosis Date  . Chicken pox   . Hypertension   . Hyperglycemia   . Allergic rhinitis   . Non-alcoholic fatty liver disease    Past Surgical History  Procedure Laterality Date  . Abdominal hysterectomy  2009  . Cesarean section  [phone], 2002  . Oophorectomy     Family History  Problem Relation Age of Onset  . Drug abuse Mother   . Heart disease Mother   . Hypertension Mother   . Alcohol abuse Father   . Sudden Cardiac Death Maternal Grandmother   . Heart disease Maternal Grandmother   . Hypertension Mother   . COPD Father    Social History  Substance Use Topics  . Smoking status: Never Smoker   . Smokeless tobacco: Never Used  . Alcohol Use: No   Review of Systems  Cardiovascular:       Elevated BP.  All other systems reviewed and are negative.  Objective:   Today's Vitals: BP 146/82 mmHg  Pulse 72  Temp(Src)  98 F (36.7 C) (Oral)  Ht 5' 3"  (1.6 m)  Wt 188 lb 8 oz (85.503 kg)  BMI 33.40 kg/m2  SpO2 96%  Physical Exam  Constitutional: She is oriented to person, place, and time. She appears well-developed and well-nourished. No distress.  HENT:  Head: Normocephalic and atraumatic.  Mouth/Throat: Oropharynx is clear and moist. No oropharyngeal exudate.  Normal TM's bilaterally.   Eyes: Conjunctivae are normal. No scleral icterus.  Neck: Neck supple. No thyromegaly present.  Cardiovascular: Normal rate and regular rhythm.   No murmur heard. Pulmonary/Chest: Effort normal and breath sounds normal. She has no wheezes. She has no rales.  Abdominal: Soft. She exhibits no distension. There is no tenderness. There is no rebound and no guarding.  Musculoskeletal: Normal range of motion. She exhibits no edema.  Lymphadenopathy:    She has no cervical adenopathy.  Neurological: She is alert and oriented to person, place, and time.  Skin: Skin is warm and dry. No rash noted.  Psychiatric: She has a normal mood and affect.  Vitals reviewed.  Assessment & Plan:   Problem List Items Addressed This Visit    None    Visit Diagnoses    Screening for deficiency anemia    -  Primary  Relevant Orders    CBC    Essential hypertension        Relevant Medications    losartan (COZAAR) 50 MG tablet    Other Relevant Orders    Comp Met (CMET)    Screening, lipid        Relevant Orders    Lipid Profile    Obesity (BMI 30.0-34.9)        Relevant Orders    HgB A1c    Encounter for screening colonoscopy        Relevant Orders    Ambulatory referral to Gastroenterology       Outpatient Encounter Prescriptions as of 10/21/2015  Medication Sig  . [DISCONTINUED] losartan (COZAAR) 25 MG tablet   . losartan (COZAAR) 50 MG tablet Take 1 tablet (50 mg total) by mouth daily.   No facility-administered encounter medications on file as of 10/21/2015.   Follow-up: 6 months to 1 year.   [place]

## 2015-10-21 NOTE — Assessment & Plan Note (Signed)
BP elevated today. Labs today. Losartan refilled.

## 2015-10-21 NOTE — Assessment & Plan Note (Signed)
Pap smear up-to-date. Patient is going to schedule her mammogram. Referral placed for colonoscopy. Tetanus up-to-date. Flu declined. Screening labs today including hepatitis C.

## 2015-10-21 NOTE — Progress Notes (Signed)
Pre visit review using our clinic review tool, if applicable. No additional management support is needed unless otherwise documented below in the visit note. 

## 2015-10-22 LAB — HEPATITIS C ANTIBODY: HCV Ab: NEGATIVE

## 2015-10-29 ENCOUNTER — Encounter: Payer: Self-pay | Admitting: Family Medicine

## 2016-02-26 ENCOUNTER — Ambulatory Visit (INDEPENDENT_AMBULATORY_CARE_PROVIDER_SITE_OTHER): Payer: BLUE CROSS/BLUE SHIELD | Admitting: Family Medicine

## 2016-02-26 ENCOUNTER — Encounter: Payer: Self-pay | Admitting: Family Medicine

## 2016-02-26 ENCOUNTER — Telehealth: Payer: Self-pay

## 2016-02-26 VITALS — BP 132/86 | HR 82 | Temp 98.2°F | Wt 195.0 lb

## 2016-02-26 DIAGNOSIS — R300 Dysuria: Secondary | ICD-10-CM | POA: Diagnosis not present

## 2016-02-26 LAB — POCT URINALYSIS DIPSTICK
Bilirubin, UA: NEGATIVE
GLUCOSE UA: NEGATIVE
Ketones, UA: NEGATIVE
Leukocytes, UA: NEGATIVE
NITRITE UA: NEGATIVE
PROTEIN UA: NEGATIVE
RBC UA: NEGATIVE
SPEC GRAV UA: 1.01
UROBILINOGEN UA: NEGATIVE
pH, UA: 6

## 2016-02-26 MED ORDER — ESTRADIOL 0.1 MG/GM VA CREA: TOPICAL_CREAM | VAGINAL | 12 refills | Status: DC

## 2016-02-26 NOTE — Assessment & Plan Note (Signed)
New acute problem. UA normal today. Likely secondary to atrophy.  Starting on Estrace.

## 2016-02-26 NOTE — Telephone Encounter (Signed)
Received a PA request for Estrace vaginal cream that was prescribed today. It says the pt has to try and fail 2 alternatives before they will cover Estrace. The [facility] website did not say what the alternatives are, but it is usually estradiol and another generic. Please make appropriate changes.

## 2016-02-26 NOTE — Patient Instructions (Signed)
Use the Estrace 2-3 times weekly. More frequently if needed.  Follow up annually.  Take care  Dr. Adriana Simas

## 2016-02-26 NOTE — Progress Notes (Signed)
Subjective:  Patient ID: Margaret Carroll, female    DOB: 02-11-1961  Age: 55 y.o. MRN: [id number]  CC: Pain with urination  HPI:  55 year old female presents with the above complaint.  Patient states she's had pain with urination for the past 2-3 days. No associated fever, chills, back pain, flank pain. No reported urgency or frequency. No known exacerbating or relieving factors. No medications or interventions tried.  Social Hx   Social History   Social History  . Marital status: Married    Spouse name: N/A  . Number of children: N/A  . Years of education: N/A   Social History Main Topics  . Smoking status: Never Smoker  . Smokeless tobacco: Never Used  . Alcohol use No  . Drug use: No  . Sexual activity: Yes    Partners: Male   Other Topics Concern  . None   Social History Narrative  . None   Review of Systems  Constitutional: Negative.   Genitourinary: Positive for dysuria. Negative for flank pain, urgency and vaginal discharge.   Objective:  BP 132/86 (BP Location: Right Arm, Patient Position: Sitting, Cuff Size: Normal)   Pulse 82   Temp 98.2 F (36.8 C) (Oral)   Wt 195 lb (88.5 kg)   SpO2 98%   BMI 34.54 kg/m   BP/Weight 02/26/2016 10/21/2015  Systolic BP 132 146  Diastolic BP 86 82  Wt. (Lbs) 195 188.5  BMI 34.54 33.4   Physical Exam  Constitutional: She is oriented to person, place, and time. She appears well-developed. No distress.  Pulmonary/Chest: Effort normal.  Abdominal: Soft. She exhibits no distension. There is no tenderness. There is no rebound and no guarding.  Genitourinary:  Genitourinary Comments: Pelvic Exam: External: Mild irritation, mild atrophy.   Neurological: She is alert and oriented to person, place, and time.  Psychiatric: She has a normal mood and affect.  Vitals reviewed.  Lab Results  Component Value Date   WBC 5.7 10/21/2015   HGB 13.8 10/21/2015   HCT 41.0 10/21/2015   PLT 221.0 10/21/2015   GLUCOSE 93  10/21/2015   CHOL 161 10/21/2015   TRIG 144.0 10/21/2015   HDL 47.80 10/21/2015   LDLCALC 84 10/21/2015   ALT 19 10/21/2015   AST 21 10/21/2015   NA 138 10/21/2015   K 3.9 10/21/2015   CL 104 10/21/2015   CREATININE 0.84 10/21/2015   BUN 17 10/21/2015   CO2 26 10/21/2015   TSH 3.76 10/13/2014   HGBA1C 5.5 10/21/2015   Results for orders placed or performed in visit on 02/26/16 (from the past 24 hour(s))  POCT Urinalysis Dipstick     Status: Normal   Collection Time: 02/26/16 10:30 AM  Result Value Ref Range   Color, UA yellow    Clarity, UA clear    Glucose, UA neg    Bilirubin, UA neg    Ketones, UA neg    Spec Grav, UA 1.010    Blood, UA neg    pH, UA 6.0    Protein, UA neg    Urobilinogen, UA negative    Nitrite, UA neg    Leukocytes, UA Negative Negative   Assessment & Plan:   Problem List Items Addressed This Visit    Dysuria - Primary    New acute problem. UA normal today. Likely secondary to atrophy.  Starting on Estrace.      Relevant Orders   POCT Urinalysis Dipstick (Completed)    Other Visit Diagnoses  None.    Meds ordered this encounter  Medications  . estradiol (ESTRACE VAGINAL) 0.1 MG/GM vaginal cream    Sig: Apply externally and intravaginally 2-3 times/week.    Dispense:  42.5 g    Refill:  12    Follow-up: PRN  Everlene Other DO [facility]

## 2016-02-27 ENCOUNTER — Other Ambulatory Visit: Payer: Self-pay | Admitting: Family Medicine

## 2016-02-27 DIAGNOSIS — N952 Postmenopausal atrophic vaginitis: Secondary | ICD-10-CM

## 2016-02-27 MED ORDER — ESTROGENS, CONJUGATED 0.625 MG/GM VA CREA: 1.0000 | TOPICAL_CREAM | Freq: Every day | VAGINAL | Status: DC

## 2016-02-27 NOTE — Telephone Encounter (Signed)
Sent in a different Rx.

## 2016-02-28 NOTE — Telephone Encounter (Signed)
Noted thanks °

## 2016-03-19 ENCOUNTER — Telehealth: Payer: Self-pay | Admitting: Family Medicine

## 2016-03-19 MED ORDER — LOSARTAN POTASSIUM 50 MG PO TABS: 50.0000 mg | ORAL_TABLET | Freq: Every day | ORAL | 0 refills | Status: DC

## 2016-03-19 NOTE — Telephone Encounter (Signed)
Pt called about medication of losartan (COZAAR) 50 MG tablet and needed to be increased to 100mg . Pt was told to call back so the Rx can be sent in with the new amount of mg.   [place] [place] [facility] [place] [id number]  Call pt @ [phone]. Thank you!

## 2016-03-19 NOTE — Telephone Encounter (Signed)
Medication refilled

## 2016-04-07 ENCOUNTER — Encounter: Payer: Self-pay | Admitting: Family Medicine

## 2016-04-07 ENCOUNTER — Ambulatory Visit (INDEPENDENT_AMBULATORY_CARE_PROVIDER_SITE_OTHER): Payer: BLUE CROSS/BLUE SHIELD | Admitting: Family Medicine

## 2016-04-07 ENCOUNTER — Encounter (INDEPENDENT_AMBULATORY_CARE_PROVIDER_SITE_OTHER): Payer: Self-pay

## 2016-04-07 VITALS — BP 122/82 | HR 88 | Temp 98.2°F | Wt 199.2 lb

## 2016-04-07 DIAGNOSIS — J988 Other specified respiratory disorders: Secondary | ICD-10-CM | POA: Diagnosis not present

## 2016-04-07 LAB — POCT RAPID STREP A (OFFICE): Rapid Strep A Screen: NEGATIVE

## 2016-04-07 MED ORDER — AMOXICILLIN-POT CLAVULANATE 875-125 MG PO TABS: 1.0000 | ORAL_TABLET | Freq: Two times a day (BID) | ORAL | 0 refills | Status: DC

## 2016-04-07 NOTE — Progress Notes (Signed)
Pre visit review using our clinic review tool, if applicable. No additional management support is needed unless otherwise documented below in the visit note. 

## 2016-04-07 NOTE — Progress Notes (Signed)
   Subjective:  Patient ID: Ephriam KnucklesKatherine E Milleson, female    DOB: 05/05/1961  Age: 55 y.o. MRN: [id number]  CC: Cough, Sore throat  HPI:  55 year old female presents with the above complaints.  Patient states that she has been sick for the past 1.5 weeks. She has been experiencing sore throat and cough. No associated fever, chills. No known exacerbating or relieving factors. No other associated symptoms. No other complaints at this time.  Social Hx   Social History   Social History  . Marital status: Married    Spouse name: N/A  . Number of children: N/A  . Years of education: N/A   Social History Main Topics  . Smoking status: Never Smoker  . Smokeless tobacco: Never Used  . Alcohol use No  . Drug use: No  . Sexual activity: Yes    Partners: Male   Other Topics Concern  . None   Social History Narrative  . None   Review of Systems  Constitutional: Negative for fever.  HENT: Positive for sore throat.   Respiratory: Positive for cough.    Objective:  BP 122/82 (BP Location: Right Arm, Patient Position: Sitting, Cuff Size: Large)   Pulse 88   Temp 98.2 F (36.8 C) (Oral)   Wt 199 lb 4 oz (90.4 kg)   SpO2 95%   BMI 35.30 kg/m   BP/Weight 04/07/2016 02/26/2016 10/21/2015  Systolic BP 122 132 146  Diastolic BP 82 86 82  Wt. (Lbs) 199.25 195 188.5  BMI 35.3 34.54 33.4   Physical Exam  Constitutional: She appears well-developed. No distress.  HENT:  Mouth/Throat: Oropharynx is clear and moist.  Normal TM's bilaterally.   Neck: Neck supple.  Cardiovascular: Normal rate and regular rhythm.   Pulmonary/Chest: Effort normal. She has no wheezes. She has no rales.  Lymphadenopathy:    She has no cervical adenopathy.  Vitals reviewed.  Lab Results  Component Value Date   WBC 5.7 10/21/2015   HGB 13.8 10/21/2015   HCT 41.0 10/21/2015   PLT 221.0 10/21/2015   GLUCOSE 93 10/21/2015   CHOL 161 10/21/2015   TRIG 144.0 10/21/2015   HDL 47.80 10/21/2015   LDLCALC 84  10/21/2015   ALT 19 10/21/2015   AST 21 10/21/2015   NA 138 10/21/2015   K 3.9 10/21/2015   CL 104 10/21/2015   CREATININE 0.84 10/21/2015   BUN 17 10/21/2015   CO2 26 10/21/2015   TSH 3.76 10/13/2014   HGBA1C 5.5 10/21/2015    Assessment & Plan:   Problem List Items Addressed This Visit    Respiratory infection - Primary    New acute problem. Rapid strep negative. Likely viral in origin.  Discuss this with patient today.  Advised supportive care; Antibiotic to be fill if she fails to improve/worsen.      Relevant Orders   Culture, Group A Strep   POCT rapid strep A (Completed)    Other Visit Diagnoses   None.     Meds ordered this encounter  Medications  . amoxicillin-clavulanate (AUGMENTIN) 875-125 MG tablet    Sig: Take 1 tablet by mouth 2 (two) times daily.    Dispense:  20 tablet    Refill:  0    Follow-up: PRN  Everlene OtherJayce Zade Falkner DO [facility] [place]

## 2016-04-07 NOTE — Patient Instructions (Signed)
Take the antibiotic if needed.  Follow up as needed.  Take care  Dr. Adriana Simasook

## 2016-04-07 NOTE — Assessment & Plan Note (Signed)
New acute problem. Rapid strep negative. Likely viral in origin.  Discuss this with patient today.  Advised supportive care; Antibiotic to be fill if she fails to improve/worsen.

## 2016-04-09 LAB — CULTURE, GROUP A STREP: Organism ID, Bacteria: NORMAL

## 2016-06-15 ENCOUNTER — Other Ambulatory Visit: Payer: Self-pay | Admitting: Family Medicine

## 2016-08-12 ENCOUNTER — Other Ambulatory Visit: Payer: Self-pay | Admitting: Family Medicine

## 2016-08-12 ENCOUNTER — Telehealth: Payer: Self-pay | Admitting: Family Medicine

## 2016-08-12 ENCOUNTER — Telehealth: Payer: Self-pay | Admitting: Radiology

## 2016-08-12 DIAGNOSIS — I10 Essential (primary) hypertension: Secondary | ICD-10-CM

## 2016-08-12 MED ORDER — HYDROCHLOROTHIAZIDE 12.5 MG PO CAPS: 12.5000 mg | ORAL_CAPSULE | Freq: Every day | ORAL | 0 refills | Status: DC

## 2016-08-12 NOTE — Telephone Encounter (Signed)
Rx sent for HCTZ

## 2016-08-12 NOTE — Telephone Encounter (Signed)
Pt called in stating that her BP have been ranging around 170/100 she increased herself to 100 mg of the losartan.  She stated that once she started taking the 100 mg it was averaging 140/100. Pt is worried about when her BP goes up to the 170. PCP was consulted and he stated pt could could have an additional medication. Pt agreed to this add -on. Pt stated that she in the past has taken HCTZ lose dose. Pt was told due to her increase in Losartan she would need labs. She is schedule for 08/13/16 @ 11 a.m.

## 2016-08-12 NOTE — Telephone Encounter (Signed)
Pt coming for labs tomorrow, please place orders. Thank you.  

## 2016-08-13 ENCOUNTER — Other Ambulatory Visit (INDEPENDENT_AMBULATORY_CARE_PROVIDER_SITE_OTHER): Payer: 59

## 2016-08-13 DIAGNOSIS — I10 Essential (primary) hypertension: Secondary | ICD-10-CM

## 2016-08-13 LAB — BASIC METABOLIC PANEL
BUN: 13 mg/dL (ref 6–23)
CHLORIDE: 102 meq/L (ref 96–112)
CO2: 27 meq/L (ref 19–32)
Calcium: 9.3 mg/dL (ref 8.4–10.5)
Creatinine, Ser: 0.94 mg/dL (ref 0.40–1.20)
GFR: 65.29 mL/min (ref >60.00)
GLUCOSE: 93 mg/dL (ref 70–99)
Potassium: 3.8 mEq/L (ref 3.5–5.1)
SODIUM: 138 meq/L (ref 135–145)

## 2016-08-14 ENCOUNTER — Ambulatory Visit (INDEPENDENT_AMBULATORY_CARE_PROVIDER_SITE_OTHER): Payer: 59 | Admitting: Family Medicine

## 2016-08-14 ENCOUNTER — Encounter: Payer: Self-pay | Admitting: Family Medicine

## 2016-08-14 VITALS — BP 132/83 | HR 92 | Temp 98.3°F | Resp 14 | Wt 205.8 lb

## 2016-08-14 DIAGNOSIS — R69 Illness, unspecified: Secondary | ICD-10-CM | POA: Diagnosis not present

## 2016-08-14 DIAGNOSIS — J111 Influenza due to unidentified influenza virus with other respiratory manifestations: Secondary | ICD-10-CM | POA: Insufficient documentation

## 2016-08-14 LAB — POCT INFLUENZA A/B
INFLUENZA B, POC: NEGATIVE
Influenza A, POC: NEGATIVE

## 2016-08-14 MED ORDER — OSELTAMIVIR PHOSPHATE 75 MG PO CAPS: 75.0000 mg | ORAL_CAPSULE | Freq: Two times a day (BID) | ORAL | 0 refills | Status: DC

## 2016-08-14 NOTE — Assessment & Plan Note (Signed)
New acute problem. Symptoms concerning for flu but influenza negative. Discussed the potential for influenza despite negative test. Given symptoms/presentation, treating with Tamiflu.

## 2016-08-14 NOTE — Patient Instructions (Signed)
Take the medication as prescribed.  Feel better  Dr. Aidyn Kellis   

## 2016-08-14 NOTE — Progress Notes (Signed)
Pre visit review using our clinic review tool, if applicable. No additional management support is needed unless otherwise documented below in the visit note. 

## 2016-08-14 NOTE — Progress Notes (Signed)
   Subjective:  Patient ID: Ephriam KnucklesKatherine E Crain, female    DOB: 12/02/1960  Age: 56 y.o. MRN: [id number]  CC: Fever, chills, body aches  HPI:  56 year old female presents for acute visit with the above complaints.  Patient states that she's been sick since Wednesday. Started with fever. Tmax 101. Then developed cough, body aches, chills, sore throat. No reported sick contacts. No known exacerbating or relieving factors. No reports of shortness of breath. No other associated symptoms. No other complaints or concerns at this time.  Social Hx   Social History   Social History  . Marital status: Married    Spouse name: N/A  . Number of children: N/A  . Years of education: N/A   Social History Main Topics  . Smoking status: Never Smoker  . Smokeless tobacco: Never Used  . Alcohol use No  . Drug use: No  . Sexual activity: Yes    Partners: Male   Other Topics Concern  . None   Social History Narrative  . None    Review of Systems  Constitutional: Positive for chills and fever.  HENT: Positive for sore throat.   Respiratory: Positive for cough.   Musculoskeletal:       Body aches.   Objective:  BP 132/83   Pulse 92   Temp 98.3 F (36.8 C) (Oral)   Resp 14   Wt 205 lb 12.8 oz (93.4 kg)   SpO2 96%   BMI 36.46 kg/m   BP/Weight 08/14/2016 04/07/2016 02/26/2016  Systolic BP 132 122 132  Diastolic BP 83 82 86  Wt. (Lbs) 205.8 199.25 195  BMI 36.46 35.3 34.54   Physical Exam  Constitutional: She is oriented to person, place, and time. She appears well-developed. No distress.  HENT:  Head: Normocephalic.  Oropharynx clear. Normal TM's bilaterally.    Cardiovascular: Normal rate and regular rhythm.   Pulmonary/Chest: Effort normal and breath sounds normal.  Neurological: She is alert and oriented to person, place, and time.  Psychiatric: She has a normal mood and affect.  Vitals reviewed.  Lab Results  Component Value Date   WBC 5.7 10/21/2015   HGB 13.8 10/21/2015     HCT 41.0 10/21/2015   PLT 221.0 10/21/2015   GLUCOSE 93 08/13/2016   CHOL 161 10/21/2015   TRIG 144.0 10/21/2015   HDL 47.80 10/21/2015   LDLCALC 84 10/21/2015   ALT 19 10/21/2015   AST 21 10/21/2015   NA 138 08/13/2016   K 3.8 08/13/2016   CL 102 08/13/2016   CREATININE 0.94 08/13/2016   BUN 13 08/13/2016   CO2 27 08/13/2016   TSH 3.76 10/13/2014   HGBA1C 5.5 10/21/2015   Assessment & Plan:   Problem List Items Addressed This Visit    Influenza-like illness - Primary    New acute problem. Symptoms concerning for flu but influenza negative. Discussed the potential for influenza despite negative test. Given symptoms/presentation, treating with Tamiflu.      Relevant Orders   POCT Influenza A/B (Completed)     Meds ordered this encounter  Medications  . oseltamivir (TAMIFLU) 75 MG capsule    Sig: Take 1 capsule (75 mg total) by mouth 2 (two) times daily.    Dispense:  10 capsule    Refill:  0    Follow-up: PRN  Everlene OtherJayce Zuriel Roskos DO [facility] [place]

## 2016-08-31 ENCOUNTER — Other Ambulatory Visit: Payer: Self-pay | Admitting: Family Medicine

## 2016-08-31 NOTE — Telephone Encounter (Signed)
Do you want her taking two tablets?

## 2016-08-31 NOTE — Telephone Encounter (Signed)
Margaret Carroll from [facility] was calling in regards to losartan (COZAAR) 50 MG tablet. Pt states to them the the directions should 2 tablets daily, and they only have for 1 1/2. Please advise, thank you!  Call @ [phone]

## 2016-09-01 MED ORDER — LOSARTAN POTASSIUM 100 MG PO TABS: ORAL_TABLET | ORAL | 1 refills | Status: DC

## 2016-11-09 ENCOUNTER — Other Ambulatory Visit: Payer: Self-pay | Admitting: Family Medicine

## 2016-12-03 ENCOUNTER — Other Ambulatory Visit: Payer: Self-pay | Admitting: Family Medicine

## 2017-02-04 ENCOUNTER — Encounter: Payer: Self-pay | Admitting: Family Medicine

## 2017-02-04 ENCOUNTER — Ambulatory Visit (INDEPENDENT_AMBULATORY_CARE_PROVIDER_SITE_OTHER): Payer: 59 | Admitting: Family Medicine

## 2017-02-04 VITALS — BP 124/80 | HR 110 | Temp 98.6°F | Resp 16 | Ht 63.0 in | Wt 206.6 lb

## 2017-02-04 DIAGNOSIS — Z0000 Encounter for general adult medical examination without abnormal findings: Secondary | ICD-10-CM

## 2017-02-04 DIAGNOSIS — Z1211 Encounter for screening for malignant neoplasm of colon: Secondary | ICD-10-CM

## 2017-02-04 LAB — COMPREHENSIVE METABOLIC PANEL
ALK PHOS: 74 U/L (ref 39–117)
ALT: 42 U/L — ABNORMAL HIGH (ref 0–35)
AST: 35 U/L (ref 0–37)
Albumin: 4.2 g/dL (ref 3.5–5.2)
BUN: 14 mg/dL (ref 6–23)
CHLORIDE: 103 meq/L (ref 96–112)
CO2: 26 meq/L (ref 19–32)
Calcium: 9.5 mg/dL (ref 8.4–10.5)
Creatinine, Ser: 1.06 mg/dL (ref 0.40–1.20)
GFR: 56.74 mL/min — AB (ref >60.00)
GLUCOSE: 95 mg/dL (ref 70–99)
POTASSIUM: 3.5 meq/L (ref 3.5–5.1)
SODIUM: 139 meq/L (ref 135–145)
TOTAL PROTEIN: 7.6 g/dL (ref 6.0–8.3)
Total Bilirubin: 0.3 mg/dL (ref 0.2–1.2)

## 2017-02-04 LAB — CBC
HCT: 41.3 % (ref 36.0–46.0)
HEMOGLOBIN: 13.9 g/dL (ref 12.0–15.0)
MCHC: 33.6 g/dL (ref 30.0–36.0)
MCV: 87.8 fl (ref 78.0–100.0)
Platelets: 233 10*3/uL (ref 150.0–400.0)
RBC: 4.7 Mil/uL (ref 3.87–5.11)
RDW: 13.4 % (ref 11.5–15.5)
WBC: 6.5 10*3/uL (ref 4.0–10.5)

## 2017-02-04 LAB — LIPID PANEL
Cholesterol: 165 mg/dL (ref 0–200)
HDL: 46 mg/dL (ref >39.00)
LDL CALC: 92 mg/dL (ref 0–99)
NONHDL: 119.26
Total CHOL/HDL Ratio: 4
Triglycerides: 134 mg/dL (ref 0.0–149.0)
VLDL: 26.8 mg/dL (ref 0.0–40.0)

## 2017-02-04 NOTE — Assessment & Plan Note (Signed)
Screening labs today. Has upcoming mammogram. Declines shingles vaccine. Referring to GI for colonoscopy. Declines HIV screening.

## 2017-02-04 NOTE — Progress Notes (Signed)
Subjective:  Patient ID: Margaret Carroll, female    DOB: 04/06/1961  Age: 56 y.o. MRN: [id number]  CC: Annual physical exam  HPI Margaret Carroll is a 56 y.o. female presents to the clinic today for an annual physical exam.  Preventative Healthcare  Pap smear: s/p hysterectomy. No longer indicated.   Mammogram: Has upcoming Mammogram on the 24th.  Colonoscopy: In need of.   Immunizations  Tetanus - Up to date.  Pneumococcal - Not indicated.   Flu - Not indicated at this time.   Zoster - In need of. Will discuss.   Hepatitis C screening - Done.  Labs: Labs today.   Alcohol use: No.  Smoking/tobacco use: No.  STD/HIV testing: Declines.   PMH, Surgical Hx, Family Hx, Social History reviewed and updated as below.  Past Medical History:  Diagnosis Date  . Allergic rhinitis   . Chicken pox   . GERD (gastroesophageal reflux disease)   . Hypertension    Past Surgical History:  Procedure Laterality Date  . ABDOMINAL HYSTERECTOMY  2009  . CESAREAN SECTION  [id number], 2002  . OOPHORECTOMY     Family History  Problem Relation Age of Onset  . Drug abuse Mother   . Heart disease Mother   . Hypertension Mother   . Alcohol abuse Father   . COPD Father   . Sudden Cardiac Death Maternal Grandmother   . Heart disease Maternal Grandmother    Social History  Substance Use Topics  . Smoking status: Never Smoker  . Smokeless tobacco: Never Used  . Alcohol use No   Review of Systems General: Denies unexplained weight loss, fever. Skin: Denies new or changing mole, sore/wound that won't heal. ENT: Trouble hearing, ringing in the ears, sores in the mouth, hoarseness, trouble swallowing. Eyes: Denies trouble seeing/visual disturbance. Heart/CV: Denies chest pain, shortness of breath, edema, palpitations. Lungs/Resp: Denies cough, shortness of breath, hemoptysis. Abd/GI: Denies nausea, vomiting, diarrhea, constipation, abdominal pain, hematochezia, melena. GU:  Denies dysuria, incontinence, hematuria, urinary frequency, difficulty starting/keeping stream, vaginal discharge, sexual difficulty, lump in breasts. MSK: Denies joint pain/swelling, myalgias. Neuro: Denies headaches, weakness, numbness, dizziness, syncope. Psych: Denies sadness, anxiety, stress, memory difficulty. Endocrine: Denies polyuria and polydipsia.  Objective:   Today's Vitals: BP 124/80   Pulse (!) 110   Temp 98.6 F (37 C) (Oral)   Resp 16   Ht 5\' 3"  (1.6 m)   Wt 206 lb 9.6 oz (93.7 kg)   SpO2 97%   BMI 36.60 kg/m   Physical Exam  Constitutional: She is oriented to person, place, and time. She appears well-developed and well-nourished. No distress.  HENT:  Head: Normocephalic and atraumatic.  Nose: Nose normal.  Mouth/Throat: Oropharynx is clear and moist. No oropharyngeal exudate.  Normal TM's bilaterally.   Eyes: Conjunctivae are normal. No scleral icterus.  Neck: Neck supple.  Cardiovascular: Normal rate and regular rhythm.   No murmur heard. Pulmonary/Chest: Effort normal and breath sounds normal. She has no wheezes. She has no rales.  Abdominal: Soft. She exhibits no distension. There is no tenderness. There is no rebound and no guarding.  Musculoskeletal: Normal range of motion. She exhibits no edema.  Lymphadenopathy:    She has no cervical adenopathy.  Neurological: She is alert and oriented to person, place, and time.  Skin: Skin is warm and dry. No rash noted.  Psychiatric: She has a normal mood and affect.  Vitals reviewed.  Assessment & Plan:   Problem List Items Addressed This  Visit      Other   Annual physical exam - Primary    Screening labs today. Has upcoming mammogram. Declines shingles vaccine. Referring to GI for colonoscopy. Declines HIV screening.      Relevant Orders   CBC (Completed)   Comprehensive metabolic panel (Completed)   Lipid panel (Completed)    Other Visit Diagnoses    Colon cancer screening       Relevant  Orders   Ambulatory referral to Gastroenterology     Follow-up: Annually  Everlene Other DO [facility]

## 2017-02-04 NOTE — Patient Instructions (Signed)
Continue your medications.  Follow up annually.  We will call regarding the colonoscopy.  Take care  Dr. Lacinda Axon   Health Maintenance, Female Adopting a healthy lifestyle and getting preventive care can go a long way to promote health and wellness. Talk with your health care provider about what schedule of regular examinations is right for you. This is a good chance for you to check in with your provider about disease prevention and staying healthy. In between checkups, there are plenty of things you can do on your own. Experts have done a lot of research about which lifestyle changes and preventive measures are most likely to keep you healthy. Ask your health care provider for more information. Weight and diet Eat a healthy diet  Be sure to include plenty of vegetables, fruits, low-fat dairy products, and lean protein.  Do not eat a lot of foods high in solid fats, added sugars, or salt.  Get regular exercise. This is one of the most important things you can do for your health. ? Most adults should exercise for at least 150 minutes each week. The exercise should increase your heart rate and make you sweat (moderate-intensity exercise). ? Most adults should also do strengthening exercises at least twice a week. This is in addition to the moderate-intensity exercise.  Maintain a healthy weight  Body mass index (BMI) is a measurement that can be used to identify possible weight problems. It estimates body fat based on height and weight. Your health care provider can help determine your BMI and help you achieve or maintain a healthy weight.  For females 52 years of age and older: ? A BMI below 18.5 is considered underweight. ? A BMI of 18.5 to 24.9 is normal. ? A BMI of 25 to 29.9 is considered overweight. ? A BMI of 30 and above is considered obese.  Watch levels of cholesterol and blood lipids  You should start having your blood tested for lipids and cholesterol at 57 years of age,  then have this test every 5 years.  You may need to have your cholesterol levels checked more often if: ? Your lipid or cholesterol levels are high. ? You are older than 57 years of age. ? You are at high risk for heart disease.  Cancer screening Lung Cancer  Lung cancer screening is recommended for adults 103-38 years old who are at high risk for lung cancer because of a history of smoking.  A yearly low-dose CT scan of the lungs is recommended for people who: ? Currently smoke. ? Have quit within the past 15 years. ? Have at least a 30-pack-year history of smoking. A pack year is smoking an average of one pack of cigarettes a day for 1 year.  Yearly screening should continue until it has been 15 years since you quit.  Yearly screening should stop if you develop a health problem that would prevent you from having lung cancer treatment.  Breast Cancer  Practice breast self-awareness. This means understanding how your breasts normally appear and feel.  It also means doing regular breast self-exams. Let your health care provider know about any changes, no matter how small.  If you are in your 20s or 30s, you should have a clinical breast exam (CBE) by a health care provider every 1-3 years as part of a regular health exam.  If you are 59 or older, have a CBE every year. Also consider having a breast X-ray (mammogram) every year.  If you have  a family history of breast cancer, talk to your health care provider about genetic screening.  If you are at high risk for breast cancer, talk to your health care provider about having an MRI and a mammogram every year.  Breast cancer gene (BRCA) assessment is recommended for women who have family members with BRCA-related cancers. BRCA-related cancers include: ? Breast. ? Ovarian. ? Tubal. ? Peritoneal cancers.  Results of the assessment will determine the need for genetic counseling and BRCA1 and BRCA2 testing.  Cervical Cancer Your  health care provider may recommend that you be screened regularly for cancer of the pelvic organs (ovaries, uterus, and vagina). This screening involves a pelvic examination, including checking for microscopic changes to the surface of your cervix (Pap test). You may be encouraged to have this screening done every 3 years, beginning at age 69.  For women ages 53-65, health care providers may recommend pelvic exams and Pap testing every 3 years, or they may recommend the Pap and pelvic exam, combined with testing for human papilloma virus (HPV), every 5 years. Some types of HPV increase your risk of cervical cancer. Testing for HPV may also be done on women of any age with unclear Pap test results.  Other health care providers may not recommend any screening for nonpregnant women who are considered low risk for pelvic cancer and who do not have symptoms. Ask your health care provider if a screening pelvic exam is right for you.  If you have had past treatment for cervical cancer or a condition that could lead to cancer, you need Pap tests and screening for cancer for at least 20 years after your treatment. If Pap tests have been discontinued, your risk factors (such as having a new sexual partner) need to be reassessed to determine if screening should resume. Some women have medical problems that increase the chance of getting cervical cancer. In these cases, your health care provider may recommend more frequent screening and Pap tests.  Colorectal Cancer  This type of cancer can be detected and often prevented.  Routine colorectal cancer screening usually begins at 57 years of age and continues through 57 years of age.  Your health care provider may recommend screening at an earlier age if you have risk factors for colon cancer.  Your health care provider may also recommend using home test kits to check for hidden blood in the stool.  A small camera at the end of a tube can be used to examine your  colon directly (sigmoidoscopy or colonoscopy). This is done to check for the earliest forms of colorectal cancer.  Routine screening usually begins at age 54.  Direct examination of the colon should be repeated every 5-10 years through 57 years of age. However, you may need to be screened more often if early forms of precancerous polyps or small growths are found.  Skin Cancer  Check your skin from head to toe regularly.  Tell your health care provider about any new moles or changes in moles, especially if there is a change in a mole's shape or color.  Also tell your health care provider if you have a mole that is larger than the size of a pencil eraser.  Always use sunscreen. Apply sunscreen liberally and repeatedly throughout the day.  Protect yourself by wearing long sleeves, pants, a wide-brimmed hat, and sunglasses whenever you are outside.  Heart disease, diabetes, and high blood pressure  High blood pressure causes heart disease and increases the risk  of stroke. High blood pressure is more likely to develop in: ? People who have blood pressure in the high end of the normal range (130-139/85-89 mm Hg). ? People who are overweight or obese. ? People who are African American.  If you are 59-62 years of age, have your blood pressure checked every 3-5 years. If you are 86 years of age or older, have your blood pressure checked every year. You should have your blood pressure measured twice-once when you are at a hospital or clinic, and once when you are not at a hospital or clinic. Record the average of the two measurements. To check your blood pressure when you are not at a hospital or clinic, you can use: ? An automated blood pressure machine at a pharmacy. ? A home blood pressure monitor.  If you are between 15 years and 33 years old, ask your health care provider if you should take aspirin to prevent strokes.  Have regular diabetes screenings. This involves taking a blood sample  to check your fasting blood sugar level. ? If you are at a normal weight and have a low risk for diabetes, have this test once every three years after 57 years of age. ? If you are overweight and have a high risk for diabetes, consider being tested at a younger age or more often. Preventing infection Hepatitis B  If you have a higher risk for hepatitis B, you should be screened for this virus. You are considered at high risk for hepatitis B if: ? You were born in a country where hepatitis B is common. Ask your health care provider which countries are considered high risk. ? Your parents were born in a high-risk country, and you have not been immunized against hepatitis B (hepatitis B vaccine). ? You have HIV or AIDS. ? You use needles to inject street drugs. ? You live with someone who has hepatitis B. ? You have had sex with someone who has hepatitis B. ? You get hemodialysis treatment. ? You take certain medicines for conditions, including cancer, organ transplantation, and autoimmune conditions.  Hepatitis C  Blood testing is recommended for: ? Everyone born from [birth year] through 1966. ? Anyone with known risk factors for hepatitis C.  Sexually transmitted infections (STIs)  You should be screened for sexually transmitted infections (STIs) including gonorrhea and chlamydia if: ? You are sexually active and are younger than 57 years of age. ? You are older than 57 years of age and your health care provider tells you that you are at risk for this type of infection. ? Your sexual activity has changed since you were last screened and you are at an increased risk for chlamydia or gonorrhea. Ask your health care provider if you are at risk.  If you do not have HIV, but are at risk, it may be recommended that you take a prescription medicine daily to prevent HIV infection. This is called pre-exposure prophylaxis (PrEP). You are considered at risk if: ? You are sexually active and do not  regularly use condoms or know the HIV status of your partner(s). ? You take drugs by injection. ? You are sexually active with a partner who has HIV.  Talk with your health care provider about whether you are at high risk of being infected with HIV. If you choose to begin PrEP, you should first be tested for HIV. You should then be tested every 3 months for as long as you are taking PrEP. Pregnancy  If you are premenopausal and you may become pregnant, ask your health care provider about preconception counseling.  If you may become pregnant, take 400 to 800 micrograms (mcg) of folic acid every day.  If you want to prevent pregnancy, talk to your health care provider about birth control (contraception). Osteoporosis and menopause  Osteoporosis is a disease in which the bones lose minerals and strength with aging. This can result in serious bone fractures. Your risk for osteoporosis can be identified using a bone density scan.  If you are 37 years of age or older, or if you are at risk for osteoporosis and fractures, ask your health care provider if you should be screened.  Ask your health care provider whether you should take a calcium or vitamin D supplement to lower your risk for osteoporosis.  Menopause may have certain physical symptoms and risks.  Hormone replacement therapy may reduce some of these symptoms and risks. Talk to your health care provider about whether hormone replacement therapy is right for you. Follow these instructions at home:  Schedule regular health, dental, and eye exams.  Stay current with your immunizations.  Do not use any tobacco products including cigarettes, chewing tobacco, or electronic cigarettes.  If you are pregnant, do not drink alcohol.  If you are breastfeeding, limit how much and how often you drink alcohol.  Limit alcohol intake to no more than 1 drink per day for nonpregnant women. One drink equals 12 ounces of beer, 5 ounces of wine, or  1 ounces of hard liquor.  Do not use street drugs.  Do not share needles.  Ask your health care provider for help if you need support or information about quitting drugs.  Tell your health care provider if you often feel depressed.  Tell your health care provider if you have ever been abused or do not feel safe at home. This information is not intended to replace advice given to you by your health care provider. Make sure you discuss any questions you have with your health care provider. Document Released: 02/02/2011 Document Revised: 12/26/2015 Document Reviewed: 04/23/2015 Elsevier Interactive Patient Education  [employer].

## 2017-02-12 ENCOUNTER — Telehealth: Payer: Self-pay | Admitting: Family Medicine

## 2017-02-12 NOTE — Telephone Encounter (Signed)
Pt called and had a follow question in regards to lab work. Pt was a little concerned in regards to the GFR. Please advise, thank you!  Call pt @ [phone]

## 2017-02-12 NOTE — Telephone Encounter (Signed)
Mildly elevated liver enzyme. Likely transient. Would recommend repeating labs. GFR was decreased. No further intervention is needed. She should stay hydrated. We will continue to monitor closely.

## 2017-02-12 NOTE — Telephone Encounter (Signed)
Left voice mail to call back 

## 2017-02-16 NOTE — Telephone Encounter (Signed)
Patient advised of below and verbalized understanding.  

## 2017-02-23 ENCOUNTER — Encounter: Payer: Self-pay | Admitting: Obstetrics and Gynecology

## 2017-02-23 ENCOUNTER — Ambulatory Visit (INDEPENDENT_AMBULATORY_CARE_PROVIDER_SITE_OTHER): Payer: 59 | Admitting: Obstetrics and Gynecology

## 2017-02-23 VITALS — BP 118/78 | Ht 63.0 in | Wt 207.0 lb

## 2017-02-23 DIAGNOSIS — Z01419 Encounter for gynecological examination (general) (routine) without abnormal findings: Secondary | ICD-10-CM | POA: Diagnosis not present

## 2017-02-23 DIAGNOSIS — Z1239 Encounter for other screening for malignant neoplasm of breast: Secondary | ICD-10-CM

## 2017-02-23 DIAGNOSIS — N951 Menopausal and female climacteric states: Secondary | ICD-10-CM

## 2017-02-23 DIAGNOSIS — Z1231 Encounter for screening mammogram for malignant neoplasm of breast: Secondary | ICD-10-CM | POA: Diagnosis not present

## 2017-02-23 MED ORDER — ESTRADIOL 0.1 MG/GM VA CREA: TOPICAL_CREAM | VAGINAL | 1 refills | Status: DC

## 2017-02-23 NOTE — Progress Notes (Signed)
Chief Complaint  Patient presents with  . Annual Exam    HPI:      [name] is a 56 y.o. No obstetric history on file. who LMP was No LMP recorded. Patient has had a hysterectomy., presents today for her annual examination.  Her menses are absent due to menopause/hyst. She does not have intermenstrual bleeding.  She does not have vasomotor sx.  Sex activity: single partner, contraception - post menopausal status. She does have vaginal dryness. She uses estrace crm with sx improvement.  Last Pap: August 29, 2014  Results were: no abnormalities /neg HPV DNA.  Hx of STDs: none  Last mammogram: August 29, 2014  Results were: normal--routine follow-up in 12 months There is no FH of breast cancer. There is no FH of ovarian cancer. The patient does do self-breast exams.  Colonoscopy: scheduled with PCP for this yr   Tobacco use: The patient denies current or previous tobacco use. Alcohol use: none Exercise: moderately active  She does get adequate calcium and Vitamin D in her diet. Labs with PCP.   Past Medical History:  Diagnosis Date  . Allergic rhinitis   . Chicken pox   . GERD (gastroesophageal reflux disease)   . Hypertension     Past Surgical History:  Procedure Laterality Date  . ABDOMINAL HYSTERECTOMY  2009  . CESAREAN SECTION  [phone], 2002  . OOPHORECTOMY      Family History  Problem Relation Age of Onset  . Drug abuse Mother   . Heart disease Mother   . Hypertension Mother   . Alcohol abuse Father   . COPD Father   . Sudden Cardiac Death Maternal Grandmother   . Heart disease Maternal Grandmother     Social History   Social History  . Marital status: Married    Spouse name: N/A  . Number of children: N/A  . Years of education: N/A   Occupational History  . Not on file.   Social History Main Topics  . Smoking status: Never Smoker  . Smokeless tobacco: Never Used  . Alcohol use No  . Drug use: No  . Sexual activity: Yes     Partners: Male    Birth control/ protection: Post-menopausal   Other Topics Concern  . Not on file   Social History Narrative  . No narrative on file     Current Outpatient Prescriptions:  .  estradiol (ESTRACE VAGINAL) 0.1 MG/GM vaginal cream, Apply externally and intravaginally 1 times/week., Disp: 42.5 g, Rfl: 1 .  hydrochlorothiazide (MICROZIDE) 12.5 MG capsule, TAKE 1 CAPSULE BY MOUTH EVERY MORNING, Disp: 90 capsule, Rfl: 1 .  losartan (COZAAR) 100 MG tablet, TAKE ONE TABLET BY MOUTH EVERY DAY, Disp: 90 tablet, Rfl: 2   ROS:  Review of Systems  Constitutional: Negative for fatigue, fever and unexpected weight change.  Respiratory: Negative for cough, shortness of breath and wheezing.   Cardiovascular: Negative for chest pain, palpitations and leg swelling.  Gastrointestinal: Negative for blood in stool, constipation, diarrhea, nausea and vomiting.  Endocrine: Negative for cold intolerance, heat intolerance and polyuria.  Genitourinary: Negative for dyspareunia, dysuria, flank pain, frequency, genital sores, hematuria, menstrual problem, pelvic pain, urgency, vaginal bleeding, vaginal discharge and vaginal pain.  Musculoskeletal: Negative for back pain, joint swelling and myalgias.  Skin: Negative for rash.  Neurological: Negative for dizziness, syncope, light-headedness, numbness and headaches.  Hematological: Negative for adenopathy.  Psychiatric/Behavioral: Negative for agitation, confusion, sleep disturbance and suicidal ideas. The patient is  not nervous/anxious.      Objective: BP 118/78   Ht 5\' 3"  (1.6 m)   Wt 207 lb (93.9 kg)   BMI 36.67 kg/m    Physical Exam  Constitutional: She is oriented to person, place, and time. She appears well-developed and well-nourished.  Genitourinary: Vagina normal. There is no rash or tenderness on the right labia. There is no rash or tenderness on the left labia. No erythema or tenderness in the vagina. No vaginal discharge  found. Right adnexum does not display mass and does not display tenderness. Left adnexum does not display mass and does not display tenderness.  Genitourinary Comments: UTERUS/CX SURG REM  Neck: Normal range of motion. No thyromegaly present.  Cardiovascular: Normal rate, regular rhythm and normal heart sounds.   No murmur heard. Pulmonary/Chest: Effort normal and breath sounds normal. Right breast exhibits no mass, no nipple discharge, no skin change and no tenderness. Left breast exhibits no mass, no nipple discharge, no skin change and no tenderness.  Abdominal: Soft. There is no tenderness. There is no guarding.  Musculoskeletal: Normal range of motion.  Neurological: She is alert and oriented to person, place, and time. No cranial nerve deficit.  Psychiatric: She has a normal mood and affect. Her behavior is normal.  Vitals reviewed.   Assessment/Plan:  Encounter for annual routine gynecological examination  Screening for breast cancer - Pt to sched mammo. - Plan: MM DIGITAL SCREENING BILATERAL  Vaginal dryness, menopausal - Rx RF estrace crm, coupon card. - Plan: estradiol (ESTRACE VAGINAL) 0.1 MG/GM vaginal cream  Meds ordered this encounter  Medications  . estradiol (ESTRACE VAGINAL) 0.1 MG/GM vaginal cream    Sig: Apply externally and intravaginally 1 times/week.    Dispense:  42.5 g    Refill:  1            GYN counsel mammography screening, menopause, adequate intake of calcium and vitamin D     F/U  Return in about 1 year (around 02/23/2018).  Alicia B. Copland, PA-C 02/23/2017 10:00 AM

## 2017-03-04 ENCOUNTER — Other Ambulatory Visit: Payer: Self-pay | Admitting: Family Medicine

## 2017-03-04 ENCOUNTER — Telehealth: Payer: Self-pay | Admitting: Radiology

## 2017-03-04 DIAGNOSIS — R7989 Other specified abnormal findings of blood chemistry: Secondary | ICD-10-CM

## 2017-03-04 DIAGNOSIS — R945 Abnormal results of liver function studies: Principal | ICD-10-CM

## 2017-03-04 NOTE — Telephone Encounter (Signed)
Pt coming in for labs tomorrow, please place future orders. Thank you.  

## 2017-03-05 ENCOUNTER — Other Ambulatory Visit (INDEPENDENT_AMBULATORY_CARE_PROVIDER_SITE_OTHER): Payer: 59

## 2017-03-05 DIAGNOSIS — R7989 Other specified abnormal findings of blood chemistry: Secondary | ICD-10-CM

## 2017-03-05 DIAGNOSIS — R945 Abnormal results of liver function studies: Principal | ICD-10-CM

## 2017-03-05 LAB — HEPATIC FUNCTION PANEL
ALK PHOS: 65 U/L (ref 39–117)
ALT: 31 U/L (ref 0–35)
AST: 31 U/L (ref 0–37)
Albumin: 3.9 g/dL (ref 3.5–5.2)
BILIRUBIN DIRECT: 0.1 mg/dL (ref 0.0–0.3)
Total Bilirubin: 0.5 mg/dL (ref 0.2–1.2)
Total Protein: 7.1 g/dL (ref 6.0–8.3)

## 2017-04-14 ENCOUNTER — Other Ambulatory Visit: Payer: Self-pay | Admitting: Family Medicine

## 2017-09-20 ENCOUNTER — Ambulatory Visit: Payer: 59 | Admitting: Family Medicine

## 2017-09-20 ENCOUNTER — Encounter: Payer: Self-pay | Admitting: Family Medicine

## 2017-09-20 VITALS — BP 120/82 | HR 81 | Temp 98.4°F | Wt 205.2 lb

## 2017-09-20 DIAGNOSIS — A084 Viral intestinal infection, unspecified: Secondary | ICD-10-CM

## 2017-09-20 NOTE — Patient Instructions (Signed)
Please be in touch if continued diarrhea past Wed- will check stool studies   Viral Gastroenteritis, Adult Viral gastroenteritis is also known as the stomach flu. This condition is caused by various viruses. These viruses can be passed from person to person very easily (are very contagious). This condition may affect your stomach, small intestine, and large intestine. It can cause sudden watery diarrhea, fever, and vomiting. Diarrhea and vomiting can make you feel weak and cause you to become dehydrated. You may not be able to keep fluids down. Dehydration can make you tired and thirsty, cause you to have a dry mouth, and decrease how often you urinate. Older adults and people with other diseases or a weak immune system are at higher risk for dehydration. It is important to replace the fluids that you lose from diarrhea and vomiting. If you become severely dehydrated, you may need to get fluids through an IV tube. What are the causes? Gastroenteritis is caused by various viruses, including rotavirus and norovirus. Norovirus is the most common cause in adults. You can get sick by eating food, drinking water, or touching a surface contaminated with one of these viruses. You can also get sick from sharing utensils or other personal items with an infected person. What increases the risk? This condition is more likely to develop in people:  Who have a weak defense system (immune system).  Who live with one or more children who are younger than 28 years old.  Who live in a nursing home.  Who go on cruise ships.  What are the signs or symptoms? Symptoms of this condition start suddenly 1-2 days after exposure to a virus. Symptoms may last a few days or as long as a week. The most common symptoms are watery diarrhea and vomiting. Other symptoms include:  Fever.  Headache.  Fatigue.  Pain in the abdomen.  Chills.  Weakness.  Nausea.  Muscle aches.  Loss of appetite.  How is this  diagnosed? This condition is diagnosed with a medical history and physical exam. You may also have a stool test to check for viruses or other infections. How is this treated? This condition typically goes away on its own. The focus of treatment is to restore lost fluids (rehydration). Your health care provider may recommend that you take an oral rehydration solution (ORS) to replace important salts and minerals (electrolytes) in your body. Severe cases of this condition may require giving fluids through an IV tube. Treatment may also include medicine to help with your symptoms. Follow these instructions at home: Follow instructions from your health care provider about how to care for yourself at home. Eating and drinking Follow these recommendations as told by your health care provider:  Take an ORS. This is a drink that is sold at pharmacies and retail stores.  Drink clear fluids in small amounts as you are able. Clear fluids include water, ice chips, diluted fruit juice, and low-calorie sports drinks.  Eat bland, easy-to-digest foods in small amounts as you are able. These foods include bananas, applesauce, rice, lean meats, toast, and crackers.  Avoid fluids that contain a lot of sugar or caffeine, such as energy drinks, sports drinks, and soda.  Avoid alcohol.  Avoid spicy or fatty foods.  General instructions   Drink enough fluid to keep your urine clear or pale yellow.  Wash your hands often. If soap and water are not available, use hand sanitizer.  Make sure that all people in your household wash their hands  well and often.  Take over-the-counter and prescription medicines only as told by your health care provider.  Rest at home while you recover.  Watch your condition for any changes.  Take a warm bath to relieve any burning or pain from frequent diarrhea episodes.  Keep all follow-up visits as told by your health care provider. This is important. Contact a health care  provider if:  You cannot keep fluids down.  Your symptoms get worse.  You have new symptoms.  You feel light-headed or dizzy.  You have muscle cramps. Get help right away if:  You have chest pain.  You feel extremely weak or you faint.  You see blood in your vomit.  Your vomit looks like coffee grounds.  You have bloody or black stools or stools that look like tar.  You have a severe headache, a stiff neck, or both.  You have a rash.  You have severe pain, cramping, or bloating in your abdomen.  You have trouble breathing or you are breathing very quickly.  Your heart is beating very quickly.  Your skin feels cold and clammy.  You feel confused.  You have pain when you urinate.  You have signs of dehydration, such as: ? Dark urine, very little urine, or no urine. ? Cracked lips. ? Dry mouth. ? Sunken eyes. ? Sleepiness. ? Weakness. This information is not intended to replace advice given to you by your health care provider. Make sure you discuss any questions you have with your health care provider. Document Released: 07/20/2005 Document Revised: 01/01/2016 Document Reviewed: 03/26/2015 Elsevier Interactive Patient Education  [employer]2018 [employer]

## 2017-09-20 NOTE — Progress Notes (Signed)
   Subjective:    Patient ID: Margaret Carroll, female    DOB: 04/27/1961, 57 y.o.   MRN: [id number]  HPI This is a [age]yo female who presents today with diarrhea x 4 days, watery bowel movement after eating. Fever to 100.7 3 days ago, no fever last couple of days. No nausea or vomiting. 4-5 episodes of diarrhea daily. Crampy pain prior to diarrhea but no pain in between. No incontinence. A little achy, mild headache. Feels like she is at end of a viral illness. Has been eating and drinking ok. Denies dysuria, frequency.   Returned from a trip to [place] 09/16/16. Was there a couple of days. Drank bottled water the entire trip. No street food. No antibiotics in last 2 months. Trip was with [employer]. No known sick contacts, but travelers were all strangers from all over.   Past Medical History:  Diagnosis Date  . Allergic rhinitis   . Chicken pox   . GERD (gastroesophageal reflux disease)   . Hypertension    Past Surgical History:  Procedure Laterality Date  . ABDOMINAL HYSTERECTOMY  2009  . CESAREAN SECTION  [phone], 2002  . OOPHORECTOMY     Family History  Problem Relation Age of Onset  . Drug abuse Mother   . Heart disease Mother   . Hypertension Mother   . Alcohol abuse Father   . COPD Father   . Sudden Cardiac Death Maternal Grandmother   . Heart disease Maternal Grandmother    Social History   Tobacco Use  . Smoking status: Never Smoker  . Smokeless tobacco: Never Used  Substance Use Topics  . Alcohol use: No    Alcohol/week: 0.0 oz  . Drug use: No      Review of Systems Per HPI    Objective:   Physical Exam  Constitutional: She is oriented to person, place, and time. She appears well-developed and well-nourished. No distress.  HENT:  Head: Normocephalic and atraumatic.  Eyes: Conjunctivae are normal.  Neck: Normal range of motion. Neck supple.  Cardiovascular: Normal rate, regular rhythm and normal heart sounds.  Pulmonary/Chest: Effort normal  and breath sounds normal.  Abdominal: Soft. Bowel sounds are normal. She exhibits no distension. There is tenderness (mild, lower abdomen).  Neurological: She is alert and oriented to person, place, and time.  Skin: Skin is warm and dry. She is not diaphoretic.  Psychiatric: She has a normal mood and affect. Her behavior is normal. Judgment and thought content normal.  Vitals reviewed.     BP 120/82   Pulse 81   Temp 98.4 F (36.9 C) (Oral)   Wt 205 lb 4 oz (93.1 kg)   SpO2 95%   BMI 36.36 kg/m  Wt Readings from Last 3 Encounters:  09/20/17 205 lb 4 oz (93.1 kg)  02/23/17 207 lb (93.9 kg)  02/04/17 206 lb 9.6 oz (93.7 kg)       Assessment & Plan:  1. Viral gastroenteritis - appears well hydrated with normal vs - Provided written and verbal information regarding diagnosis and treatment. - encouraged her to eat and drink small quantities frequently until diarrhea resolved - RTC precautions reviewed, if diarrhea not resolved in 3-4 days, check stool studies.   Olean Reeeborah Gessner, FNP-BC  [facility] [place], [facility]  09/20/2017 10:31 AM

## 2017-10-22 ENCOUNTER — Other Ambulatory Visit: Payer: Self-pay | Admitting: Family Medicine

## 2017-10-26 ENCOUNTER — Ambulatory Visit (INDEPENDENT_AMBULATORY_CARE_PROVIDER_SITE_OTHER): Payer: 59 | Admitting: Internal Medicine

## 2017-10-26 ENCOUNTER — Encounter: Payer: Self-pay | Admitting: Internal Medicine

## 2017-10-26 VITALS — BP 128/80 | HR 76 | Temp 97.6°F | Ht 63.0 in | Wt 209.8 lb

## 2017-10-26 DIAGNOSIS — R7303 Prediabetes: Secondary | ICD-10-CM | POA: Diagnosis not present

## 2017-10-26 DIAGNOSIS — I10 Essential (primary) hypertension: Secondary | ICD-10-CM

## 2017-10-26 DIAGNOSIS — R220 Localized swelling, mass and lump, head: Secondary | ICD-10-CM

## 2017-10-26 DIAGNOSIS — E559 Vitamin D deficiency, unspecified: Secondary | ICD-10-CM | POA: Diagnosis not present

## 2017-10-26 DIAGNOSIS — Z1329 Encounter for screening for other suspected endocrine disorder: Secondary | ICD-10-CM

## 2017-10-26 DIAGNOSIS — K118 Other diseases of salivary glands: Secondary | ICD-10-CM | POA: Insufficient documentation

## 2017-10-26 DIAGNOSIS — N951 Menopausal and female climacteric states: Secondary | ICD-10-CM

## 2017-10-26 LAB — COMPREHENSIVE METABOLIC PANEL
ALBUMIN: 4 g/dL (ref 3.5–5.2)
ALT: 58 U/L — ABNORMAL HIGH (ref 0–35)
AST: 45 U/L — ABNORMAL HIGH (ref 0–37)
Alkaline Phosphatase: 68 U/L (ref 39–117)
BUN: 14 mg/dL (ref 6–23)
CHLORIDE: 104 meq/L (ref 96–112)
CO2: 27 mEq/L (ref 19–32)
Calcium: 9.4 mg/dL (ref 8.4–10.5)
Creatinine, Ser: 0.88 mg/dL (ref 0.40–1.20)
GFR: 70.15 mL/min (ref >60.00)
GLUCOSE: 112 mg/dL — AB (ref 70–99)
POTASSIUM: 4.1 meq/L (ref 3.5–5.1)
Sodium: 140 mEq/L (ref 135–145)
TOTAL PROTEIN: 7.5 g/dL (ref 6.0–8.3)
Total Bilirubin: 0.3 mg/dL (ref 0.2–1.2)

## 2017-10-26 LAB — CBC WITH DIFFERENTIAL/PLATELET
Basophils Absolute: 0 10*3/uL (ref 0.0–0.1)
Basophils Relative: 0.6 % (ref 0.0–3.0)
EOS PCT: 3 % (ref 0.0–5.0)
Eosinophils Absolute: 0.2 10*3/uL (ref 0.0–0.7)
HCT: 41.9 % (ref 36.0–46.0)
HEMOGLOBIN: 14.4 g/dL (ref 12.0–15.0)
Lymphocytes Relative: 40.9 % (ref 12.0–46.0)
Lymphs Abs: 2.5 10*3/uL (ref 0.7–4.0)
MCHC: 34.4 g/dL (ref 30.0–36.0)
MCV: 87.8 fl (ref 78.0–100.0)
MONOS PCT: 7.8 % (ref 3.0–12.0)
Monocytes Absolute: 0.5 10*3/uL (ref 0.1–1.0)
Neutro Abs: 2.9 10*3/uL (ref 1.4–7.7)
Neutrophils Relative %: 47.7 % (ref 43.0–77.0)
Platelets: 230 10*3/uL (ref 150.0–400.0)
RBC: 4.77 Mil/uL (ref 3.87–5.11)
RDW: 13.6 % (ref 11.5–15.5)
WBC: 6.1 10*3/uL (ref 4.0–10.5)

## 2017-10-26 LAB — URINALYSIS, ROUTINE W REFLEX MICROSCOPIC
BILIRUBIN URINE: NEGATIVE
HGB URINE DIPSTICK: NEGATIVE
Ketones, ur: NEGATIVE
NITRITE: NEGATIVE
RBC / HPF: NONE SEEN (ref 0–?)
Specific Gravity, Urine: 1.01 (ref 1.000–1.030)
TOTAL PROTEIN, URINE-UPE24: NEGATIVE
Urine Glucose: NEGATIVE
Urobilinogen, UA: 0.2 (ref 0.0–1.0)
pH: 6.5 (ref 5.0–8.0)

## 2017-10-26 LAB — HEMOGLOBIN A1C: HEMOGLOBIN A1C: 5.7 % (ref 4.6–6.5)

## 2017-10-26 LAB — T4, FREE: Free T4: 0.8 ng/dL (ref 0.60–1.60)

## 2017-10-26 LAB — TSH: TSH: 3.27 u[IU]/mL (ref 0.35–4.50)

## 2017-10-26 LAB — VITAMIN D 25 HYDROXY (VIT D DEFICIENCY, FRACTURES): VITD: 38.55 ng/mL (ref 30.00–100.00)

## 2017-10-26 MED ORDER — LOSARTAN POTASSIUM 100 MG PO TABS: 100.0000 mg | ORAL_TABLET | Freq: Every day | ORAL | 3 refills | Status: DC

## 2017-10-26 MED ORDER — HYDROCHLOROTHIAZIDE 12.5 MG PO CAPS
12.5000 mg | ORAL_CAPSULE | Freq: Every morning | ORAL | 3 refills | Status: DC
Start: 2017-10-26 — End: 2018-12-02

## 2017-10-26 MED ORDER — ESTRADIOL 0.1 MG/GM VA CREA: TOPICAL_CREAM | VAGINAL | 1 refills | Status: DC

## 2017-10-26 NOTE — Telephone Encounter (Signed)
Has appointmetn with dr. Shirlee LatchMclean today.

## 2017-10-26 NOTE — Progress Notes (Signed)
Pre visit review using our clinic review tool, if applicable. No additional management support is needed unless otherwise documented below in the visit note. 

## 2017-10-26 NOTE — Telephone Encounter (Signed)
Patient has appointment with PCP on 10/26/17 ok to fill?

## 2017-10-26 NOTE — Patient Instructions (Addendum)
Get back with me on GI referral please  We will refer to CT of parotid gland and check on price     Hypertension Hypertension, commonly called high blood pressure, is when the force of blood pumping through the arteries is too strong. The arteries are the blood vessels that carry blood from the heart throughout the body. Hypertension forces the heart to work harder to pump blood and may cause arteries to become narrow or stiff. Having untreated or uncontrolled hypertension can cause heart attacks, strokes, kidney disease, and other problems. A blood pressure reading consists of a higher number over a lower number. Ideally, your blood pressure should be below 120/80. The first ("top") number is called the systolic pressure. It is a measure of the pressure in your arteries as your heart beats. The second ("bottom") number is called the diastolic pressure. It is a measure of the pressure in your arteries as the heart relaxes. What are the causes? The cause of this condition is not known. What increases the risk? Some risk factors for high blood pressure are under your control. Others are not. Factors you can change  Smoking.  Having type 2 diabetes mellitus, high cholesterol, or both.  Not getting enough exercise or physical activity.  Being overweight.  Having too much fat, sugar, calories, or salt (sodium) in your diet.  Drinking too much alcohol. Factors that are difficult or impossible to change  Having chronic kidney disease.  Having a family history of high blood pressure.  Age. Risk increases with age.  Race. You may be at higher risk if you are African-American.  Gender. Men are at higher risk than women before age 8. After age 50, women are at higher risk than men.  Having obstructive sleep apnea.  Stress. What are the signs or symptoms? Extremely high blood pressure (hypertensive crisis) may cause:  Headache.  Anxiety.  Shortness of  breath.  Nosebleed.  Nausea and vomiting.  Severe chest pain.  Jerky movements you cannot control (seizures).  How is this diagnosed? This condition is diagnosed by measuring your blood pressure while you are seated, with your arm resting on a surface. The cuff of the blood pressure monitor will be placed directly against the skin of your upper arm at the level of your heart. It should be measured at least twice using the same arm. Certain conditions can cause a difference in blood pressure between your right and left arms. Certain factors can cause blood pressure readings to be lower or higher than normal (elevated) for a short period of time:  When your blood pressure is higher when you are in a health care provider's office than when you are at home, this is called white coat hypertension. Most people with this condition do not need medicines.  When your blood pressure is higher at home than when you are in a health care provider's office, this is called masked hypertension. Most people with this condition may need medicines to control blood pressure.  If you have a high blood pressure reading during one visit or you have normal blood pressure with other risk factors:  You may be asked to return on a different day to have your blood pressure checked again.  You may be asked to monitor your blood pressure at home for 1 week or longer.  If you are diagnosed with hypertension, you may have other blood or imaging tests to help your health care provider understand your overall risk for other conditions. How  is this treated? This condition is treated by making healthy lifestyle changes, such as eating healthy foods, exercising more, and reducing your alcohol intake. Your health care provider may prescribe medicine if lifestyle changes are not enough to get your blood pressure under control, and if:  Your systolic blood pressure is above 130.  Your diastolic blood pressure is above  80.  Your personal target blood pressure may vary depending on your medical conditions, your age, and other factors. Follow these instructions at home: Eating and drinking  Eat a diet that is high in fiber and potassium, and low in sodium, added sugar, and fat. An example eating plan is called the DASH (Dietary Approaches to Stop Hypertension) diet. To eat this way: ? Eat plenty of fresh fruits and vegetables. Try to fill half of your plate at each meal with fruits and vegetables. ? Eat whole grains, such as whole wheat pasta, brown rice, or whole grain bread. Fill about one quarter of your plate with whole grains. ? Eat or drink low-fat dairy products, such as skim milk or low-fat yogurt. ? Avoid fatty cuts of meat, processed or cured meats, and poultry with skin. Fill about one quarter of your plate with lean proteins, such as fish, chicken without skin, beans, eggs, and tofu. ? Avoid premade and processed foods. These tend to be higher in sodium, added sugar, and fat.  Reduce your daily sodium intake. Most people with hypertension should eat less than 1,500 mg of sodium a day.  Limit alcohol intake to no more than 1 drink a day for nonpregnant women and 2 drinks a day for men. One drink equals 12 oz of beer, 5 oz of wine, or 1 oz of hard liquor. Lifestyle  Work with your health care provider to maintain a healthy body weight or to lose weight. Ask what an ideal weight is for you.  Get at least 30 minutes of exercise that causes your heart to beat faster (aerobic exercise) most days of the week. Activities may include walking, swimming, or biking.  Include exercise to strengthen your muscles (resistance exercise), such as pilates or lifting weights, as part of your weekly exercise routine. Try to do these types of exercises for 30 minutes at least 3 days a week.  Do not use any products that contain nicotine or tobacco, such as cigarettes and e-cigarettes. If you need help quitting, ask  your health care provider.  Monitor your blood pressure at home as told by your health care provider.  Keep all follow-up visits as told by your health care provider. This is important. Medicines  Take over-the-counter and prescription medicines only as told by your health care provider. Follow directions carefully. Blood pressure medicines must be taken as prescribed.  Do not skip doses of blood pressure medicine. Doing this puts you at risk for problems and can make the medicine less effective.  Ask your health care provider about side effects or reactions to medicines that you should watch for. Contact a health care provider if:  You think you are having a reaction to a medicine you are taking.  You have headaches that keep coming back (recurring).  You feel dizzy.  You have swelling in your ankles.  You have trouble with your vision. Get help right away if:  You develop a severe headache or confusion.  You have unusual weakness or numbness.  You feel faint.  You have severe pain in your chest or abdomen.  You vomit repeatedly.  You have trouble breathing. Summary  Hypertension is when the force of blood pumping through your arteries is too strong. If this condition is not controlled, it may put you at risk for serious complications.  Your personal target blood pressure may vary depending on your medical conditions, your age, and other factors. For most people, a normal blood pressure is less than 120/80.  Hypertension is treated with lifestyle changes, medicines, or a combination of both. Lifestyle changes include weight loss, eating a healthy, low-sodium diet, exercising more, and limiting alcohol. This information is not intended to replace advice given to you by your health care provider. Make sure you discuss any questions you have with your health care provider. Document Released: 07/20/2005 Document Revised: 06/17/2016 Document Reviewed: 06/17/2016 Elsevier  Interactive Patient Education  [employer]2018 Elsevier Inc.

## 2017-10-28 ENCOUNTER — Encounter: Payer: Self-pay | Admitting: Internal Medicine

## 2017-10-28 DIAGNOSIS — N951 Menopausal and female climacteric states: Secondary | ICD-10-CM | POA: Insufficient documentation

## 2017-10-28 DIAGNOSIS — R220 Localized swelling, mass and lump, head: Secondary | ICD-10-CM | POA: Insufficient documentation

## 2017-10-28 NOTE — Progress Notes (Addendum)
Chief Complaint  Patient presents with  . Follow-up   Establish care  1. HTn controlled on hctz 12.5 and cozaar 100 mg qd. She needs Rx refills   2. Reviewed prior imaging she has CT: 11/2009-->disc with pt she never had work up with ENT and would consider repeat CT if knew the cost   FINDINGS: Evaluation of the right parotid in the region of palpable  concern  demonstrates a small 6.4 cm enhancing nodule along the serosal surface.  This  is an indeterminate appearing nodule. This is in the region of palpable  concern. A second nodule is identified along the anterior apex of the  parotid on the right measuring 7.1 millimeters. There is a hint of a  dicrotic notch within this nodule. No further masses or nodule is  identified. There is no evidence of free fluid or loculated fluid  collections. Opacified vascular structures are unremarkable. The paranasal  sinuses are patent. The skull base is unremarkable.   IMPRESSION:   Very small indeterminate nodule in the region of palpable concern and a  second nodule along the anterior/superior border of the parotid. Further  evaluation with ENT consultation is recommended. Differential  considerations  are parotid soft tissue nodules versus small parotid lymph nodes. The  benignity of these nodules cannot be determined from this study.  3. C/o skin lesion to left calf follows with Dr. Kirtland Bouchard dermatology advised to make appt.   Review of Systems  Constitutional: Negative for weight loss.  HENT: Negative for hearing loss.        +parotid mass at times ttp but overall does not bother   Eyes: Negative for blurred vision.  Respiratory: Negative for shortness of breath.   Cardiovascular: Negative for chest pain.  Gastrointestinal: Negative for abdominal pain.  Musculoskeletal: Negative for falls.  Skin:       +lesion to left lower leg   Neurological: Negative for headaches.  Psychiatric/Behavioral: Negative for memory loss.   Past Medical  History:  Diagnosis Date  . Allergic rhinitis   . Chicken pox   . GERD (gastroesophageal reflux disease)   . Hypertension    Past Surgical History:  Procedure Laterality Date  . ABDOMINAL HYSTERECTOMY  2009  . CESAREAN SECTION  [id number], 2002  . OOPHORECTOMY     Family History  Problem Relation Age of Onset  . Drug abuse Mother   . Heart disease Mother   . Hypertension Mother   . Alcohol abuse Father   . COPD Father   . Sudden Cardiac Death Maternal Grandmother   . Heart disease Maternal Grandmother    Social History   Socioeconomic History  . Marital status: Married    Spouse name: Not on file  . Number of children: Not on file  . Years of education: Not on file  . Highest education level: Not on file  Occupational History  . Not on file  Social Needs  . Financial resource strain: Not on file  . Food insecurity:    Worry: Not on file    Inability: Not on file  . Transportation needs:    Medical: Not on file    Non-medical: Not on file  Tobacco Use  . Smoking status: Never Smoker  . Smokeless tobacco: Never Used  Substance and Sexual Activity  . Alcohol use: No    Alcohol/week: 0.0 oz  . Drug use: No  . Sexual activity: Yes    Partners: Male    Birth control/protection: Post-menopausal  Lifestyle  . Physical activity:    Days per week: Not on file    Minutes per session: Not on file  . Stress: Not on file  Relationships  . Social connections:    Talks on phone: Not on file    Gets together: Not on file    Attends religious service: Not on file    Active member of club or organization: Not on file    Attends meetings of clubs or organizations: Not on file    Relationship status: Not on file  . Intimate partner violence:    Fear of current or ex partner: Not on file    Emotionally abused: Not on file    Physically abused: Not on file    Forced sexual activity: Not on file  Other Topics Concern  . Not on file  Social History Narrative  . Not  on file   No outpatient medications have been marked as taking for the 10/26/17 encounter (Office Visit) with McLean-Scocuzza, Pasty Spillers, MD.   No Known Allergies Recent Results (from the past 2160 hour(s))  Comprehensive metabolic panel     Status: Abnormal   Collection Time: 10/26/17  3:15 PM  Result Value Ref Range   Sodium 140 135 - 145 mEq/L   Potassium 4.1 3.5 - 5.1 mEq/L   Chloride 104 96 - 112 mEq/L   CO2 27 19 - 32 mEq/L   Glucose, Bld 112 (H) 70 - 99 mg/dL   BUN 14 6 - 23 mg/dL   Creatinine, Ser 1.61 0.40 - 1.20 mg/dL   Total Bilirubin 0.3 0.2 - 1.2 mg/dL   Alkaline Phosphatase 68 39 - 117 U/L   AST 45 (H) 0 - 37 U/L   ALT 58 (H) 0 - 35 U/L   Total Protein 7.5 6.0 - 8.3 g/dL   Albumin 4.0 3.5 - 5.2 g/dL   Calcium 9.4 8.4 - 09.6 mg/dL   GFR 04.54 >09.81 mL/min  CBC with Differential/Platelet     Status: None   Collection Time: 10/26/17  3:15 PM  Result Value Ref Range   WBC 6.1 4.0 - 10.5 K/uL   RBC 4.77 3.87 - 5.11 Mil/uL   Hemoglobin 14.4 12.0 - 15.0 g/dL   HCT 19.1 47.8 - 29.5 %   MCV 87.8 78.0 - 100.0 fl   MCHC 34.4 30.0 - 36.0 g/dL   RDW 62.1 30.8 - 65.7 %   Platelets 230.0 150.0 - 400.0 K/uL   Neutrophils Relative % 47.7 43.0 - 77.0 %   Lymphocytes Relative 40.9 12.0 - 46.0 %   Monocytes Relative 7.8 3.0 - 12.0 %   Eosinophils Relative 3.0 0.0 - 5.0 %   Basophils Relative 0.6 0.0 - 3.0 %   Neutro Abs 2.9 1.4 - 7.7 K/uL   Lymphs Abs 2.5 0.7 - 4.0 K/uL   Monocytes Absolute 0.5 0.1 - 1.0 K/uL   Eosinophils Absolute 0.2 0.0 - 0.7 K/uL   Basophils Absolute 0.0 0.0 - 0.1 K/uL  TSH     Status: None   Collection Time: 10/26/17  3:15 PM  Result Value Ref Range   TSH 3.27 0.35 - 4.50 uIU/mL  T4, free     Status: None   Collection Time: 10/26/17  3:15 PM  Result Value Ref Range   Free T4 0.80 0.60 - 1.60 ng/dL    Comment: Specimens from patients who are undergoing biotin therapy and /or ingesting biotin supplements may contain high levels of biotin.  The higher  biotin concentration in these specimens interferes with this Free T4 assay.  Specimens that contain high levels  of biotin may cause false high results for this Free T4 assay.  Please interpret results in light of the total clinical presentation of the patient.    Hemoglobin A1c     Status: None   Collection Time: 10/26/17  3:15 PM  Result Value Ref Range   Hgb A1c MFr Bld 5.7 4.6 - 6.5 %    Comment: Glycemic Control Guidelines for People with Diabetes:Non Diabetic:  <6%Goal of Therapy: <7%Additional Action Suggested:  >8%   Urinalysis, Routine w reflex microscopic     Status: Abnormal   Collection Time: 10/26/17  3:15 PM  Result Value Ref Range   Color, Urine YELLOW Yellow;Lt. Yellow   APPearance CLEAR Clear   Specific Gravity, Urine 1.010 1.000 - 1.030   pH 6.5 5.0 - 8.0   Total Protein, Urine NEGATIVE Negative   Urine Glucose NEGATIVE Negative   Ketones, ur NEGATIVE Negative   Bilirubin Urine NEGATIVE Negative   Hgb urine dipstick NEGATIVE Negative   Urobilinogen, UA 0.2 0.0 - 1.0   Leukocytes, UA SMALL (A) Negative   Nitrite NEGATIVE Negative   WBC, UA 3-6/hpf (A) 0-2/hpf   RBC / HPF none seen 0-2/hpf   Squamous Epithelial / LPF Rare(0-4/hpf) Rare(0-4/hpf)  Vitamin D (25 hydroxy)     Status: None   Collection Time: 10/26/17  3:15 PM  Result Value Ref Range   VITD 38.55 30.00 - 100.00 ng/mL   Objective  Body mass index is 37.16 kg/m. Wt Readings from Last 3 Encounters:  10/26/17 209 lb 12.8 oz (95.2 kg)  09/20/17 205 lb 4 oz (93.1 kg)  02/23/17 207 lb (93.9 kg)   Temp Readings from Last 3 Encounters:  10/26/17 97.6 F (36.4 C) (Oral)  09/20/17 98.4 F (36.9 C) (Oral)  02/04/17 98.6 F (37 C) (Oral)   BP Readings from Last 3 Encounters:  10/26/17 128/80  09/20/17 120/82  02/23/17 118/78   Pulse Readings from Last 3 Encounters:  10/26/17 76  09/20/17 81  02/04/17 (!) 110    Physical Exam  Constitutional: She is oriented to person, place, and time and  well-developed, well-nourished, and in no distress. Vital signs are normal.  HENT:  Head: Normocephalic and atraumatic.    Mouth/Throat: Oropharynx is clear and moist and mucous membranes are normal.  Eyes: Pupils are equal, round, and reactive to light. Conjunctivae are normal.  Cardiovascular: Normal rate, regular rhythm and normal heart sounds.  Pulmonary/Chest: Effort normal and breath sounds normal.  Neurological: She is alert and oriented to person, place, and time. Gait normal. Gait normal.  Skin: Skin is warm and dry.     Psychiatric: Mood, memory, affect and judgment normal.  Nursing note and vitals reviewed.   Assessment   1. HTN controlled  2. CT 11/2009 with 6.4 nodule indeterminate and second nodules apex of parotid 7.1 mm rec further ENT consult to determine if benign nature pt never had this consult.  ddx soft parotid tissue nodule -pt reports area is sore at times but stable size  3. VV left calf 4. HM  Plan  1.  Refilled hctz 12.5, losartan 100 mg qd  2. rec repeat CT and rec ENT consult pt agreeable to CT only if she knows the cost but advised this can be disc with [facility] biling  3. rec f/u with Dr. Kirtland BouchardK dermatology  4.  Did not have flu shot  Given info shingrix  Consider repeat Tdap in fuure due   Check labs CMET, CBC, wait on lipid due 02/04/18, UA, TSH, T4, vit D, A1C  mammo neg 04/19/17  Get pap [facility] records 04/2017 s/p total hysterectomy for ovarian cyst but per pt had pap 04/2017 -saw [facility] 02/23/17 notes no pap attached will request   Colonoscopy pt will get back with me may want to have 01/2018   Provider: Dr. French Ana McLean-Scocuzza-Internal Medicine

## 2017-10-29 ENCOUNTER — Encounter: Payer: Self-pay | Admitting: *Deleted

## 2017-11-01 ENCOUNTER — Encounter (INDEPENDENT_AMBULATORY_CARE_PROVIDER_SITE_OTHER): Payer: Self-pay

## 2017-11-01 ENCOUNTER — Other Ambulatory Visit: Payer: Self-pay | Admitting: Internal Medicine

## 2017-11-01 DIAGNOSIS — R7989 Other specified abnormal findings of blood chemistry: Secondary | ICD-10-CM

## 2017-11-01 DIAGNOSIS — R945 Abnormal results of liver function studies: Principal | ICD-10-CM

## 2017-11-04 ENCOUNTER — Encounter (INDEPENDENT_AMBULATORY_CARE_PROVIDER_SITE_OTHER): Payer: Self-pay

## 2017-11-08 ENCOUNTER — Ambulatory Visit
Admission: RE | Admit: 2017-11-08 | Discharge: 2017-11-08 | Disposition: A | Discharge status: Home / Self Care | Payer: 59 | Source: Ambulatory Visit | Attending: Internal Medicine | Admitting: Internal Medicine

## 2017-11-08 DIAGNOSIS — R945 Abnormal results of liver function studies: Secondary | ICD-10-CM

## 2017-11-08 DIAGNOSIS — K8020 Calculus of gallbladder without cholecystitis without obstruction: Secondary | ICD-10-CM | POA: Diagnosis not present

## 2017-11-08 DIAGNOSIS — R7989 Other specified abnormal findings of blood chemistry: Secondary | ICD-10-CM | POA: Diagnosis present

## 2017-11-10 ENCOUNTER — Encounter (INDEPENDENT_AMBULATORY_CARE_PROVIDER_SITE_OTHER): Payer: Self-pay

## 2017-11-18 ENCOUNTER — Ambulatory Visit: Payer: 59

## 2017-12-07 ENCOUNTER — Ambulatory Visit: Admission: RE | Admit: 2017-12-07 | Payer: 59 | Source: Ambulatory Visit

## 2017-12-28 ENCOUNTER — Ambulatory Visit: Admission: RE | Admit: 2017-12-28 | Payer: 59 | Source: Ambulatory Visit

## 2018-02-07 ENCOUNTER — Encounter: Payer: 59 | Admitting: Family Medicine

## 2018-04-28 ENCOUNTER — Ambulatory Visit: Payer: 59 | Admitting: Internal Medicine

## 2018-12-02 ENCOUNTER — Telehealth: Payer: Self-pay | Admitting: Internal Medicine

## 2018-12-02 ENCOUNTER — Other Ambulatory Visit: Payer: Self-pay | Admitting: Internal Medicine

## 2018-12-02 DIAGNOSIS — I10 Essential (primary) hypertension: Secondary | ICD-10-CM

## 2018-12-02 MED ORDER — LOSARTAN POTASSIUM 100 MG PO TABS: 100.0000 mg | ORAL_TABLET | Freq: Every day | ORAL | 0 refills | Status: DC

## 2018-12-02 MED ORDER — HYDROCHLOROTHIAZIDE 12.5 MG PO CAPS: 12.5000 mg | ORAL_CAPSULE | Freq: Every morning | ORAL | 0 refills | Status: DC

## 2018-12-02 NOTE — Telephone Encounter (Signed)
See prior note   TMS 

## 2018-12-02 NOTE — Telephone Encounter (Signed)
Please sch virtual visit it has been 1 year since she has had appt  Will need for further refills of BP med given #90 pills   Thanks TSM

## 2018-12-05 NOTE — Telephone Encounter (Signed)
Mychart has been sent to notify patient 

## 2019-01-23 ENCOUNTER — Ambulatory Visit: Payer: Self-pay | Admitting: Internal Medicine

## 2019-01-23 NOTE — Telephone Encounter (Signed)
Pt. Reports she started feeling bad last Tuesday with sore throat. Now has cough, chest tightness and fatigue. Warm transfer to [place] for a virtual visit.  Answer Assessment - Initial Assessment Questions 1. COVID-19 DIAGNOSIS: "Who made your Coronavirus (COVID-19) diagnosis?" "Was it confirmed by a positive lab test?" If not diagnosed by a HCP, ask "Are there lots of cases (community spread) where you live?" (See public health department website, if unsure)     No test 2. ONSET: "When did the COVID-19 symptoms start?"      Started last Tuesday 3. WORST SYMPTOM: "What is your worst symptom?" (e.g., cough, fever, shortness of breath, muscle aches)     Sore throat 4. COUGH: "Do you have a cough?" If so, ask: "How bad is the cough?"       Cough - mild 5. FEVER: "Do you have a fever?" If so, ask: "What is your temperature, how was it measured, and when did it start?"     No 6. RESPIRATORY STATUS: "Describe your breathing?" (e.g., shortness of breath, wheezing, unable to speak)      Chest tightness 7. BETTER-SAME-WORSE: "Are you getting better, staying the same or getting worse compared to yesterday?"  If getting worse, ask, "In what way?"     Same 8. HIGH RISK DISEASE: "Do you have any chronic medical problems?" (e.g., asthma, heart or lung disease, weak immune system, etc.)     HTN 9. PREGNANCY: "Is there any chance you are pregnant?" "When was your last menstrual period?"     No 10. OTHER SYMPTOMS: "Do you have any other symptoms?"  (e.g., chills, fatigue, headache, loss of smell or taste, muscle pain, sore throat)       Fatigue, dizziness  Protocols used: CORONAVIRUS (COVID-19) DIAGNOSED OR SUSPECTED-A-AH

## 2019-01-23 NOTE — Telephone Encounter (Addendum)
2 Attempts made to return call  To patient in order to schedule visit to  arrange testing message left to call office. Will notify PCP.

## 2019-01-23 NOTE — Telephone Encounter (Signed)
Please try to call pt again to schedule appt for covid 19 concerns or if wants to be seen today can see another provider or go to [facility]gent care or ED  [place]

## 2019-01-23 NOTE — Telephone Encounter (Signed)
Left message  To return cal  to office.

## 2019-01-23 NOTE — Telephone Encounter (Signed)
Left message for patient to return call  To office, and to give advice for staying home and self monitoring.

## 2019-01-24 NOTE — Telephone Encounter (Signed)
Left detailed message again asking if patient wanted to have virtual visit to discuss being tested for COVID-19. I asked patient to please return our call.

## 2019-01-27 NOTE — Telephone Encounter (Signed)
Left message to call office,

## 2019-02-14 ENCOUNTER — Encounter: Payer: 59 | Admitting: Internal Medicine

## 2019-02-23 ENCOUNTER — Other Ambulatory Visit: Payer: Self-pay | Admitting: Internal Medicine

## 2019-02-23 DIAGNOSIS — I10 Essential (primary) hypertension: Secondary | ICD-10-CM

## 2019-02-23 MED ORDER — HYDROCHLOROTHIAZIDE 12.5 MG PO CAPS: 12.5000 mg | ORAL_CAPSULE | Freq: Every morning | ORAL | 0 refills | Status: DC

## 2019-02-23 MED ORDER — LOSARTAN POTASSIUM 100 MG PO TABS: 100.0000 mg | ORAL_TABLET | Freq: Every day | ORAL | 0 refills | Status: DC

## 2019-03-24 ENCOUNTER — Encounter: Payer: 59 | Admitting: Internal Medicine

## 2019-03-29 ENCOUNTER — Other Ambulatory Visit: Payer: Self-pay | Admitting: Internal Medicine

## 2019-03-29 DIAGNOSIS — I10 Essential (primary) hypertension: Secondary | ICD-10-CM

## 2019-03-29 MED ORDER — HYDROCHLOROTHIAZIDE 12.5 MG PO CAPS: 12.5000 mg | ORAL_CAPSULE | Freq: Every morning | ORAL | 0 refills | Status: DC

## 2019-03-29 MED ORDER — LOSARTAN POTASSIUM 100 MG PO TABS: 100.0000 mg | ORAL_TABLET | Freq: Every day | ORAL | 0 refills | Status: DC

## 2019-05-23 ENCOUNTER — Other Ambulatory Visit: Payer: Self-pay

## 2019-05-24 ENCOUNTER — Ambulatory Visit (INDEPENDENT_AMBULATORY_CARE_PROVIDER_SITE_OTHER): Payer: 59 | Admitting: Internal Medicine

## 2019-05-24 ENCOUNTER — Encounter: Payer: Self-pay | Admitting: Internal Medicine

## 2019-05-24 ENCOUNTER — Other Ambulatory Visit: Payer: Self-pay

## 2019-05-24 VITALS — BP 116/76 | HR 67 | Temp 98.0°F | Ht 63.0 in | Wt 208.8 lb

## 2019-05-24 DIAGNOSIS — Z0000 Encounter for general adult medical examination without abnormal findings: Secondary | ICD-10-CM

## 2019-05-24 DIAGNOSIS — Z1389 Encounter for screening for other disorder: Secondary | ICD-10-CM

## 2019-05-24 DIAGNOSIS — K760 Fatty (change of) liver, not elsewhere classified: Secondary | ICD-10-CM

## 2019-05-24 DIAGNOSIS — Z1231 Encounter for screening mammogram for malignant neoplasm of breast: Secondary | ICD-10-CM | POA: Diagnosis not present

## 2019-05-24 DIAGNOSIS — Z1329 Encounter for screening for other suspected endocrine disorder: Secondary | ICD-10-CM

## 2019-05-24 DIAGNOSIS — I10 Essential (primary) hypertension: Secondary | ICD-10-CM | POA: Diagnosis not present

## 2019-05-24 DIAGNOSIS — R7303 Prediabetes: Secondary | ICD-10-CM

## 2019-05-24 DIAGNOSIS — K8020 Calculus of gallbladder without cholecystitis without obstruction: Secondary | ICD-10-CM

## 2019-05-24 DIAGNOSIS — L719 Rosacea, unspecified: Secondary | ICD-10-CM | POA: Diagnosis not present

## 2019-05-24 MED ORDER — HYDROCHLOROTHIAZIDE 12.5 MG PO CAPS: 12.5000 mg | ORAL_CAPSULE | Freq: Every morning | ORAL | 3 refills | Status: DC

## 2019-05-24 MED ORDER — METRONIDAZOLE 1 % EX GEL: Freq: Every day | CUTANEOUS | 12 refills | Status: DC

## 2019-05-24 MED ORDER — LOSARTAN POTASSIUM 100 MG PO TABS: 100.0000 mg | ORAL_TABLET | Freq: Every day | ORAL | 3 refills | Status: DC

## 2019-05-24 NOTE — Patient Instructions (Addendum)
shingrix vaccine x2 doses 2nd dose 2 to <6 months than 1st  Tdap here   [facility] alicia copeland PA or Dr. Georgianne Fick   Call me back 10/2019 to schedule colonoscopy  [facility] [place]  [facility] [place]   cetaphil or cerave cream please   Rosacea Rosacea is a long-term (chronic) condition that affects the skin of the face, including the cheeks, nose, forehead, and chin. This condition can also affect the eyes. Rosacea causes blood vessels near the surface of the skin to enlarge, which results in redness. What are the causes? The cause of this condition is not known. Certain triggers can make rosacea worse, including:  Hot baths.  Exercise.  Sunlight.  Very hot or cold temperatures.  Hot or spicy foods and drinks.  Drinking alcohol.  Stress.  Taking blood pressure medicine.  Long-term use of topical steroids on the face. What increases the risk? You are more likely to develop this condition if you:  Are older than 59 years of age.  Are a woman.  Have light-colored skin (light complexion).  Have a family history of rosacea. What are the signs or symptoms? Symptoms of this condition include:  Redness of the face.  Red bumps or pimples on the face.  A red, enlarged nose.  Blushing easily.  Red lines on the skin.  Irritated, burning, or itchy feeling in the eyes.  Swollen eyelids.  Drainage from the eyes.  Feeling like there is something in your eye. How is this diagnosed? This condition is diagnosed with a medical history and physical exam. How is this treated? There is no cure for this condition, but treatment can help to control your symptoms. Your health care provider may recommend that you see a skin specialist (dermatologist). Treatment may include:  Medicines that are applied to the skin or taken by mouth (orally). This can include antibiotic medicines.  Laser treatment to improve the appearance of the  skin.  Surgery. This is rare. Your health care provider will also recommend the best way to take care of your skin. Even after your skin improves, you will likely need to continue treatment to prevent your rosacea from coming back. Follow these instructions at home: Skin care Take care of your skin as told by your health care provider. You may be told to do these things:  Wash your skin gently two or more times each day.  Use mild soap.  Use a sunscreen or sunblock with SPF 30 or greater.  Use gentle cosmetics that are meant for sensitive skin.  Shave with an electric shaver instead of a blade. Lifestyle  Try to keep track of what foods trigger this condition. Avoid any triggers. These may include: ? Spicy foods. ? Seafood. ? Cheese. ? Hot liquids. ? Nuts. ? Chocolate. ? Iodized salt.  Do not drink alcohol.  Avoid extremely cold or hot temperatures.  Try to reduce your stress. If you need help, talk with your health care provider.  When you exercise, do these things to stay cool: ? Limit sun exposure to your face. ? Use a fan. ? Do shorter and more frequent intervals of exercise. General instructions  Take and apply over-the-counter and prescription medicines only as told by your health care provider.  If you were prescribed an antibiotic medicine, apply it or take it as told by your health care provider. Do not stop using the antibiotic even if your condition improves.  If your eyelids are affected, apply  warm compresses to them. Do this as told by your health care provider.  Keep all follow-up visits as told by your health care provider. This is important. Contact a health care provider if:  Your symptoms get worse.  Your symptoms do not improve after 2 months of treatment.  You have new symptoms.  You have any changes in vision or you have problems with your eyes, such as redness or itching.  You feel depressed.  You lose your appetite.  You have  trouble concentrating. Summary  Rosacea is a long-term (chronic) condition that affects the skin of the face, including the cheeks, nose, forehead, and chin.  Take care of your skin as told by your health care provider.  Take and apply over-the-counter and prescription medicines only as told by your health care provider.  Contact a health care provider if your symptoms get worse or if you have any changes in vision or other problems with your eyes, such as redness or itching.  Keep all follow-up visits as told by your health care provider. This is important. This information is not intended to replace advice given to you by your health care provider. Make sure you discuss any questions you have with your health care provider. Document Released: 08/27/2004 Document Revised: 12/22/2017 Document Reviewed: 12/22/2017 Elsevier Patient Education  2020 Elsevier Inc.   Zoster Vaccine, Recombinant injection What is this medicine? ZOSTER VACCINE (ZOS ter vak SEEN) is used to prevent shingles in adults 59 years old and over. This vaccine is not used to treat shingles or nerve pain from shingles. This medicine may be used for other purposes; ask your health care provider or pharmacist if you have questions. COMMON BRAND NAME(S): [facility] What should I tell my health care provider before I take this medicine? They need to know if you have any of these conditions:  blood disorders or disease  cancer like leukemia or lymphoma  immune system problems or therapy  an unusual or allergic reaction to vaccines, other medications, foods, dyes, or preservatives  pregnant or trying to get pregnant  breast-feeding How should I use this medicine? This vaccine is for injection in a muscle. It is given by a health care professional. Talk to your pediatrician regarding the use of this medicine in children. This medicine is not approved for use in children. Overdosage: If you think you have taken too much  of this medicine contact a poison control center or emergency room at once. NOTE: This medicine is only for you. Do not share this medicine with others. What if I miss a dose? Keep appointments for follow-up (booster) doses as directed. It is important not to miss your dose. Call your doctor or health care professional if you are unable to keep an appointment. What may interact with this medicine?  medicines that suppress your immune system  medicines to treat cancer  steroid medicines like prednisone or cortisone This list may not describe all possible interactions. Give your health care provider a list of all the medicines, herbs, non-prescription drugs, or dietary supplements you use. Also tell them if you smoke, drink alcohol, or use illegal drugs. Some items may interact with your medicine. What should I watch for while using this medicine? Visit your doctor for regular check ups. This vaccine, like all vaccines, may not fully protect everyone. What side effects may I notice from receiving this medicine? Side effects that you should report to your doctor or health care professional as soon as possible:  allergic reactions like skin rash, itching or hives, swelling of the face, lips, or tongue  breathing problems Side effects that usually do not require medical attention (report these to your doctor or health care professional if they continue or are bothersome):  chills  headache  fever  nausea, vomiting  redness, warmth, pain, swelling or itching at site where injected  tiredness This list may not describe all possible side effects. Call your doctor for medical advice about side effects. You may report side effects to FDA at 1-800-FDA-1088. Where should I keep my medicine? This vaccine is only given in a clinic, pharmacy, doctor's office, or other health care setting and will not be stored at home. NOTE: This sheet is a summary. It may not cover all possible information. If  you have questions about this medicine, talk to your doctor, pharmacist, or health care provider.  2020 Elsevier/Gold Standard (2017-03-01 13:20:30)

## 2019-05-24 NOTE — Progress Notes (Signed)
Chief Complaint  Patient presents with  . Annual Exam   Annual  1. Htn controlled on hctz 12.5 mg qd and losartan 100 mg qd  2. H/o rosacea has never seen dermatology nothing tried  3. H/o elevated lfts and gallstones and fatty liver noted [place] from 2019    Review of Systems  Constitutional: Negative for weight loss.  HENT: Negative for hearing loss.   Eyes: Negative for blurred vision.  Respiratory: Negative for shortness of breath.   Cardiovascular: Negative for chest pain.  Gastrointestinal: Negative for abdominal pain.  Musculoskeletal: Negative for falls.  Skin: Positive for rash.  Neurological: Negative for headaches.  Psychiatric/Behavioral: Negative for depression.   Past Medical History:  Diagnosis Date  . Allergic rhinitis   . Chicken pox   . GERD (gastroesophageal reflux disease)   . Hypertension    Past Surgical History:  Procedure Laterality Date  . ABDOMINAL HYSTERECTOMY  2009  . CESAREAN SECTION  [phone], 2002  . OOPHORECTOMY     Family History  Problem Relation Age of Onset  . Drug abuse Mother   . Heart disease Mother   . Hypertension Mother   . Alcohol abuse Father   . COPD Father   . Sudden Cardiac Death Maternal Grandmother   . Heart disease Maternal Grandmother    Social History   Socioeconomic History  . Marital status: Married    Spouse name: Not on file  . Number of children: Not on file  . Years of education: Not on file  . Highest education level: Not on file  Occupational History  . Not on file  Social Needs  . Financial resource strain: Not on file  . Food insecurity    Worry: Not on file    Inability: Not on file  . Transportation needs    Medical: Not on file    Non-medical: Not on file  Tobacco Use  . Smoking status: Never Smoker  . Smokeless tobacco: Never Used  Substance and Sexual Activity  . Alcohol use: No    Alcohol/week: 0.0 standard drinks  . Drug use: No  . Sexual activity: Yes    Partners: Male   Birth control/protection: Post-menopausal  Lifestyle  . Physical activity    Days per week: Not on file    Minutes per session: Not on file  . Stress: Not on file  Relationships  . Social Herbalist on phone: Not on file    Gets together: Not on file    Attends religious service: Not on file    Active member of club or organization: Not on file    Attends meetings of clubs or organizations: Not on file    Relationship status: Not on file  . Intimate partner violence    Fear of current or ex partner: Not on file    Emotionally abused: Not on file    Physically abused: Not on file    Forced sexual activity: Not on file  Other Topics Concern  . Not on file  Social History Narrative   Married    Works [employer] for breast cancer pt    Likes to bike    Current Meds  Medication Sig  . estradiol (ESTRACE VAGINAL) 0.1 MG/GM vaginal cream Apply externally and intravaginally 1 times/week.  . hydrochlorothiazide (MICROZIDE) 12.5 MG capsule Take 1 capsule (12.5 mg total) by mouth every morning.  Marland Kitchen losartan (COZAAR) 100 MG tablet Take 1 tablet (100 mg total)  by mouth daily.  . [DISCONTINUED] hydrochlorothiazide (MICROZIDE) 12.5 MG capsule Take 1 capsule (12.5 mg total) by mouth every morning. appt further refills  . [DISCONTINUED] losartan (COZAAR) 100 MG tablet Take 1 tablet (100 mg total) by mouth daily. appt further refills   No Known Allergies No results found for this or any previous visit (from the past 2160 hour(s)). Objective  Body mass index is 36.99 kg/m. Wt Readings from Last 3 Encounters:  05/24/19 208 lb 12.8 oz (94.7 kg)  10/26/17 209 lb 12.8 oz (95.2 kg)  09/20/17 205 lb 4 oz (93.1 kg)   Temp Readings from Last 3 Encounters:  05/24/19 98 F (36.7 C) (Skin)  10/26/17 97.6 F (36.4 C) (Oral)  09/20/17 98.4 F (36.9 C) (Oral)   BP Readings from Last 3 Encounters:  05/24/19 116/76  10/26/17 128/80  09/20/17 120/82   Pulse Readings from Last 3  Encounters:  05/24/19 67  10/26/17 76  09/20/17 81    Physical Exam Vitals signs and nursing note reviewed.  Constitutional:      Appearance: Normal appearance. She is well-developed and well-groomed. She is obese.     Comments: +mask on    HENT:     Head: Normocephalic and atraumatic.  Eyes:     Conjunctiva/sclera: Conjunctivae normal.     Pupils: Pupils are equal, round, and reactive to light.  Cardiovascular:     Rate and Rhythm: Normal rate and regular rhythm.     Heart sounds: Normal heart sounds. No murmur.  Pulmonary:     Effort: Pulmonary effort is normal.     Breath sounds: Normal breath sounds.  Abdominal:     General: Abdomen is flat. Bowel sounds are normal.     Tenderness: There is no abdominal tenderness.  Skin:    General: Skin is warm and dry.  Neurological:     General: No focal deficit present.     Mental Status: She is alert and oriented to person, place, and time. Mental status is at baseline.     Gait: Gait normal.  Psychiatric:        Attention and Perception: Attention and perception normal.        Mood and Affect: Mood and affect normal.        Speech: Speech normal.        Behavior: Behavior normal. Behavior is cooperative.        Thought Content: Thought content normal.        Cognition and Memory: Cognition and memory normal.        Judgment: Judgment normal.     Assessment  Plan  Annual physical exam sch fasting labs asap Declines flu shot  Given info shingrix and disc Tdap today declines for now will consider in future  mammo neg 04/19/17 ordered mammogram today   Get pap [facility] records 04/2017 s/p total hysterectomy for ovarian cyst but per pt had pap 04/2017 -saw [facility] 02/23/17 notes no pap attached will request  -pt will call and schedule f/u [facility]p or Dr. Bonney Aid  Colonoscopy hold until 10/2019 per pt and will let me know wear  rec healthy diet and exercise  Skin no issues other than rosacea Plan:  metroNIDAZOLE (METROGEL) 1 % gel  Essential hypertension controlled- Plan: losartan (COZAAR) 100 MG tablet, hydrochlorothiazide (MICROZIDE) 12.5 MG capsule, Comprehensive metabolic panel, CBC with Differential/Platelet, Lipid panel  Fatty liver noted [place] 2019 - Plan: Comprehensive metabolic panel  Prediabetes - Plan: Hemoglobin A1c  Provider: Dr. Olivia Mackie McLean-Scocuzza-[facility]

## 2019-05-30 ENCOUNTER — Other Ambulatory Visit: Payer: Self-pay

## 2019-05-30 ENCOUNTER — Other Ambulatory Visit (INDEPENDENT_AMBULATORY_CARE_PROVIDER_SITE_OTHER): Payer: 59

## 2019-05-30 DIAGNOSIS — Z1329 Encounter for screening for other suspected endocrine disorder: Secondary | ICD-10-CM

## 2019-05-30 DIAGNOSIS — R7303 Prediabetes: Secondary | ICD-10-CM | POA: Diagnosis not present

## 2019-05-30 DIAGNOSIS — I10 Essential (primary) hypertension: Secondary | ICD-10-CM | POA: Diagnosis not present

## 2019-05-30 DIAGNOSIS — K760 Fatty (change of) liver, not elsewhere classified: Secondary | ICD-10-CM

## 2019-05-30 DIAGNOSIS — Z1389 Encounter for screening for other disorder: Secondary | ICD-10-CM

## 2019-05-30 LAB — COMPREHENSIVE METABOLIC PANEL
ALT: 40 U/L — ABNORMAL HIGH (ref 0–35)
AST: 29 U/L (ref 0–37)
Albumin: 4.1 g/dL (ref 3.5–5.2)
Alkaline Phosphatase: 69 U/L (ref 39–117)
BUN: 14 mg/dL (ref 6–23)
CO2: 29 mEq/L (ref 19–32)
Calcium: 9.2 mg/dL (ref 8.4–10.5)
Chloride: 103 mEq/L (ref 96–112)
Creatinine, Ser: 0.85 mg/dL (ref 0.40–1.20)
GFR: 68.32 mL/min (ref >60.00)
Glucose, Bld: 102 mg/dL — ABNORMAL HIGH (ref 70–99)
Potassium: 4 mEq/L (ref 3.5–5.1)
Sodium: 139 mEq/L (ref 135–145)
Total Bilirubin: 0.4 mg/dL (ref 0.2–1.2)
Total Protein: 6.8 g/dL (ref 6.0–8.3)

## 2019-05-30 LAB — CBC WITH DIFFERENTIAL/PLATELET
Basophils Absolute: 0 10*3/uL (ref 0.0–0.1)
Basophils Relative: 0.7 % (ref 0.0–3.0)
Eosinophils Absolute: 0.1 10*3/uL (ref 0.0–0.7)
Eosinophils Relative: 2.8 % (ref 0.0–5.0)
HCT: 42.9 % (ref 36.0–46.0)
Hemoglobin: 14.4 g/dL (ref 12.0–15.0)
Lymphocytes Relative: 38.5 % (ref 12.0–46.0)
Lymphs Abs: 2.1 10*3/uL (ref 0.7–4.0)
MCHC: 33.7 g/dL (ref 30.0–36.0)
MCV: 88.8 fl (ref 78.0–100.0)
Monocytes Absolute: 0.4 10*3/uL (ref 0.1–1.0)
Monocytes Relative: 7.3 % (ref 3.0–12.0)
Neutro Abs: 2.7 10*3/uL (ref 1.4–7.7)
Neutrophils Relative %: 50.7 % (ref 43.0–77.0)
Platelets: 235 10*3/uL (ref 150.0–400.0)
RBC: 4.82 Mil/uL (ref 3.87–5.11)
RDW: 13.1 % (ref 11.5–15.5)
WBC: 5.3 10*3/uL (ref 4.0–10.5)

## 2019-05-30 LAB — LIPID PANEL
Cholesterol: 179 mg/dL (ref 0–200)
HDL: 47.5 mg/dL (ref >39.00)
LDL Cholesterol: 108 mg/dL — ABNORMAL HIGH (ref 0–99)
NonHDL: 131.01
Total CHOL/HDL Ratio: 4
Triglycerides: 116 mg/dL (ref 0.0–149.0)
VLDL: 23.2 mg/dL (ref 0.0–40.0)

## 2019-05-30 LAB — HEMOGLOBIN A1C: Hgb A1c MFr Bld: 5.9 % (ref 4.6–6.5)

## 2019-05-31 LAB — URINALYSIS, ROUTINE W REFLEX MICROSCOPIC
Bacteria, UA: NONE SEEN /HPF
Bilirubin Urine: NEGATIVE
Glucose, UA: NEGATIVE
Hgb urine dipstick: NEGATIVE
Hyaline Cast: NONE SEEN /LPF
Ketones, ur: NEGATIVE
Nitrite: NEGATIVE
Protein, ur: NEGATIVE
RBC / HPF: NONE SEEN /HPF (ref 0–2)
Specific Gravity, Urine: 1.021 (ref 1.001–1.03)
pH: 5.5 (ref 5.0–8.0)

## 2019-06-01 ENCOUNTER — Encounter: Payer: Self-pay | Admitting: Internal Medicine

## 2019-06-01 LAB — TSH: TSH: 4.74 u[IU]/mL — ABNORMAL HIGH (ref 0.35–4.50)

## 2019-06-02 ENCOUNTER — Telehealth: Payer: Self-pay | Admitting: *Deleted

## 2019-06-02 ENCOUNTER — Telehealth: Payer: Self-pay

## 2019-06-02 ENCOUNTER — Other Ambulatory Visit: Payer: Self-pay | Admitting: Internal Medicine

## 2019-06-02 DIAGNOSIS — R7989 Other specified abnormal findings of blood chemistry: Secondary | ICD-10-CM

## 2019-06-02 DIAGNOSIS — R946 Abnormal results of thyroid function studies: Secondary | ICD-10-CM

## 2019-06-02 NOTE — Telephone Encounter (Signed)
Copied from [place] [id number]. Topic: General - Other >> Jun 02, 2019 10:21 AM Carolyn Stare wrote: Pt req a call back bout her lab results

## 2019-06-02 NOTE — Telephone Encounter (Signed)
Copied from CRM #[id number]. Topic: General - Other >> Jun 02, 2019 10:21 AM Kennedy, Cheryl W wrote: Pt req a call back bout her lab results 

## 2019-08-31 ENCOUNTER — Encounter: Payer: Self-pay | Admitting: Internal Medicine

## 2019-08-31 ENCOUNTER — Telehealth: Payer: Self-pay | Admitting: Internal Medicine

## 2019-08-31 ENCOUNTER — Other Ambulatory Visit: Payer: Self-pay

## 2019-08-31 NOTE — Telephone Encounter (Signed)
Order is needed so pt can sch mammo. Please and Thank you!

## 2019-09-01 ENCOUNTER — Encounter: Payer: Self-pay | Admitting: Internal Medicine

## 2019-09-01 ENCOUNTER — Other Ambulatory Visit: Payer: Self-pay | Admitting: Internal Medicine

## 2019-09-01 DIAGNOSIS — Z1231 Encounter for screening mammogram for malignant neoplasm of breast: Secondary | ICD-10-CM

## 2019-09-01 NOTE — Telephone Encounter (Signed)
Need to know which facility wants to go ?  TMS

## 2019-09-03 ENCOUNTER — Other Ambulatory Visit: Payer: Self-pay | Admitting: Internal Medicine

## 2019-09-03 DIAGNOSIS — I10 Essential (primary) hypertension: Secondary | ICD-10-CM

## 2019-09-03 MED ORDER — LOSARTAN POTASSIUM 50 MG PO TABS: 50.0000 mg | ORAL_TABLET | Freq: Every day | ORAL | 3 refills | Status: DC

## 2019-09-04 ENCOUNTER — Other Ambulatory Visit: Payer: Self-pay | Admitting: Internal Medicine

## 2019-09-04 ENCOUNTER — Other Ambulatory Visit (INDEPENDENT_AMBULATORY_CARE_PROVIDER_SITE_OTHER): Payer: 59

## 2019-09-04 ENCOUNTER — Other Ambulatory Visit: Payer: Self-pay

## 2019-09-04 DIAGNOSIS — R7989 Other specified abnormal findings of blood chemistry: Secondary | ICD-10-CM | POA: Diagnosis not present

## 2019-09-04 DIAGNOSIS — R946 Abnormal results of thyroid function studies: Secondary | ICD-10-CM

## 2019-09-04 LAB — TSH: TSH: 3.53 u[IU]/mL (ref 0.35–4.50)

## 2019-09-04 LAB — T3, FREE: T3, Free: 3.3 pg/mL (ref 2.3–4.2)

## 2019-09-04 LAB — T4, FREE: Free T4: 0.85 ng/dL (ref 0.60–1.60)

## 2019-09-05 LAB — THYROID PEROXIDASE ANTIBODY: Thyroperoxidase Ab SerPl-aCnc: 2 IU/mL (ref <9)

## 2019-09-05 NOTE — Telephone Encounter (Signed)
Pt stated [facility] in [place]

## 2019-09-08 ENCOUNTER — Ambulatory Visit: Payer: 59 | Attending: Internal Medicine

## 2019-09-08 DIAGNOSIS — Z23 Encounter for immunization: Secondary | ICD-10-CM

## 2019-09-25 ENCOUNTER — Encounter: Payer: Self-pay | Admitting: Internal Medicine

## 2019-10-03 ENCOUNTER — Ambulatory Visit: Payer: 59 | Attending: Internal Medicine

## 2019-10-03 DIAGNOSIS — Z23 Encounter for immunization: Secondary | ICD-10-CM | POA: Insufficient documentation

## 2019-10-03 NOTE — Progress Notes (Signed)
   Covid-19 Vaccination Clinic  Name:  SEELEY SOUTHGATE    MRN: [id number] DOB: 1961-04-12  10/03/2019  Ms. Ament was observed post Covid-19 immunization for 15 minutes without incident. She was provided with Vaccine Information Sheet and instruction to access the V-Safe system.   Ms. Wierman was instructed to call 911 with any severe reactions post vaccine: Marland Kitchen Difficulty breathing  . Swelling of face and throat  . A fast heartbeat  . A bad rash all over body  . Dizziness and weakness   Immunizations Administered    Name Date Dose VIS Date Route   Pfizer COVID-19 Vaccine 10/03/2019  8:24 AM 0.3 mL 07/14/2019 Intramuscular   Manufacturer: [employer], [employer]   Lot: [id number]   NDC: [address]-4

## 2019-10-30 IMAGING — US US ABDOMEN COMPLETE
1 series · 13 of 25 positions shown · non-contrast
Comparison: Abdominopelvic CT scan August 29, 2007

CLINICAL DATA: Elevated liver function studies.

EXAM:
ABDOMEN ULTRASOUND COMPLETE

[Series 1: us abdomen complete · 13 of 117 slices shown]
[im 1/117]
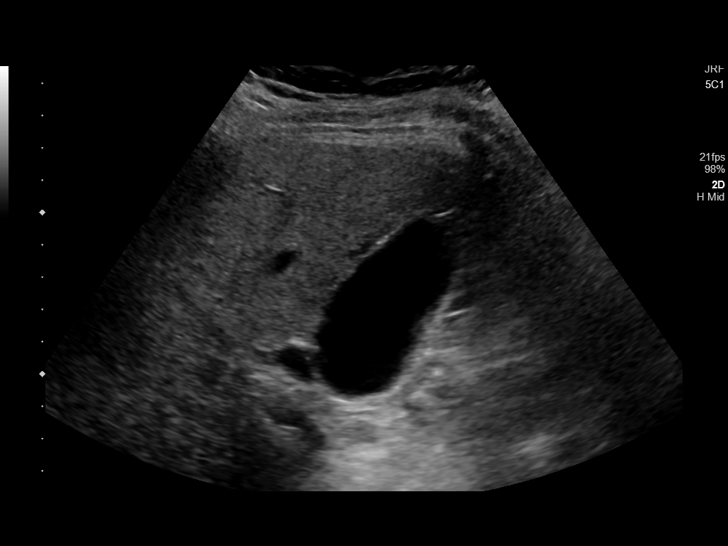
[im 10/117]
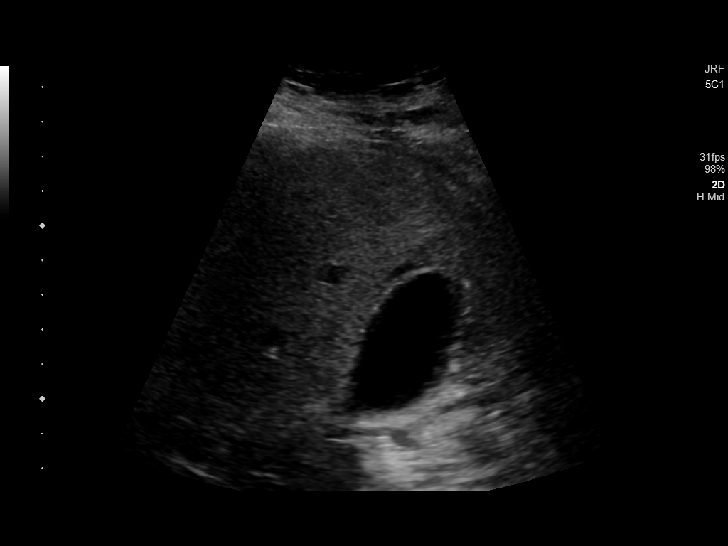
[im 20/117]
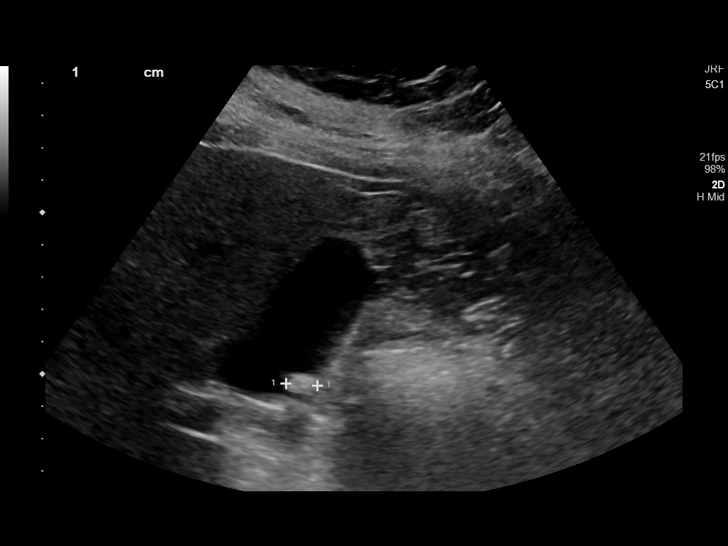
[im 30/117]
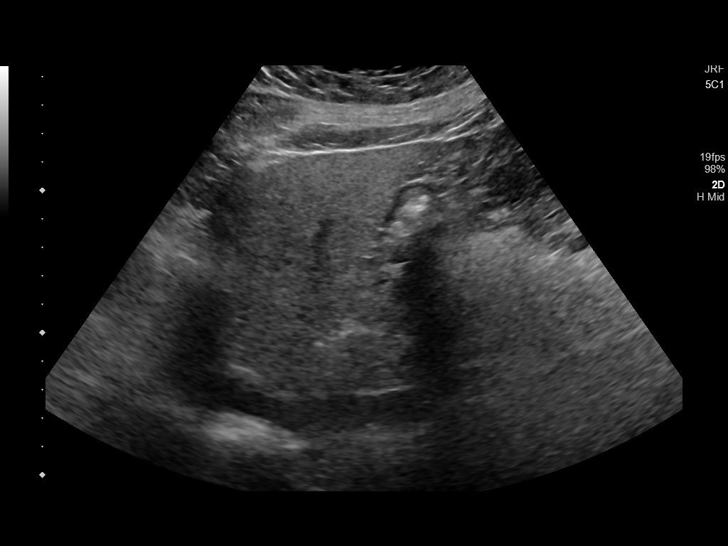
[im 39/117]
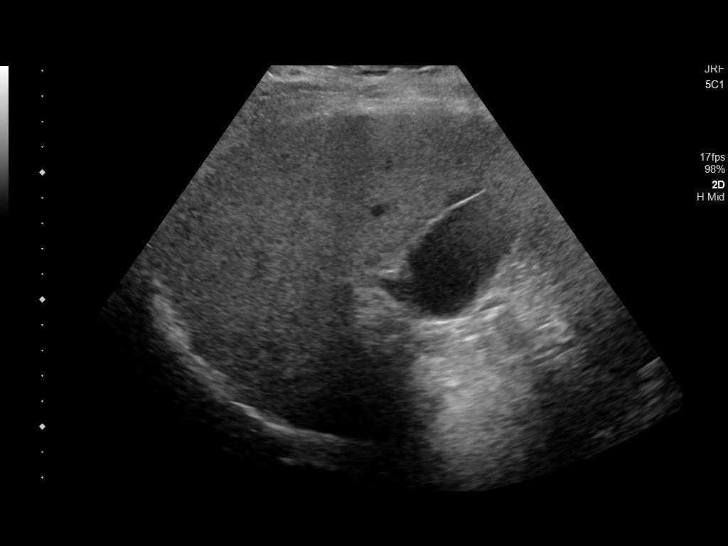
[im 49/117]
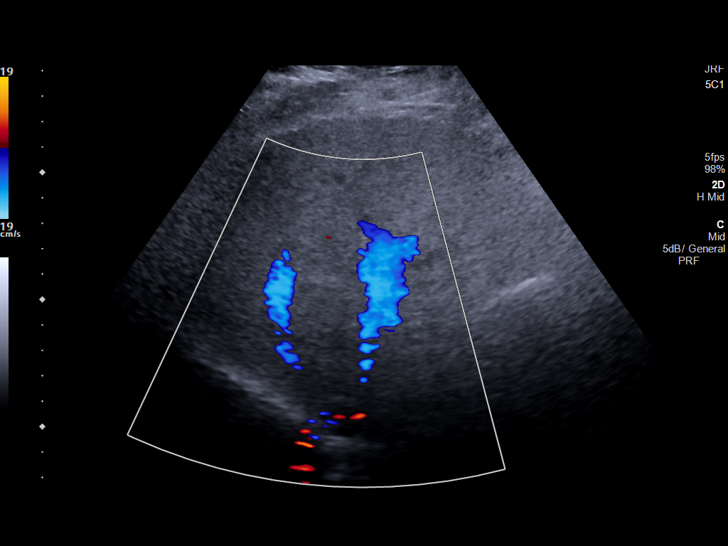
[im 59/117]
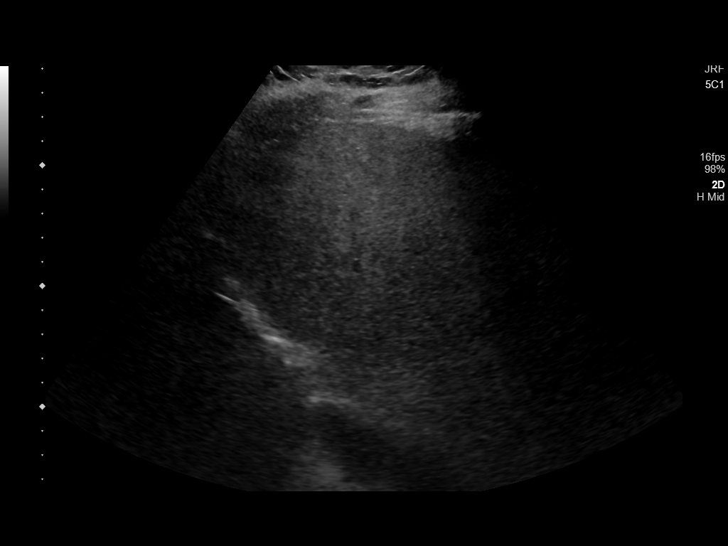
[im 68/117]
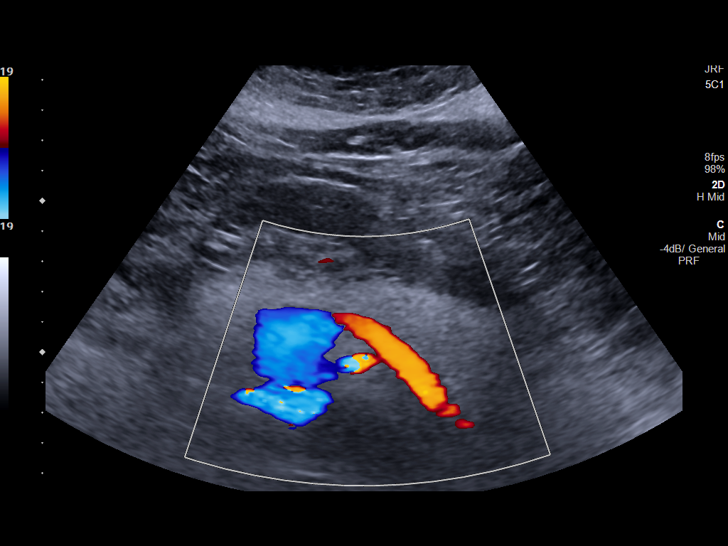
[im 78/117]
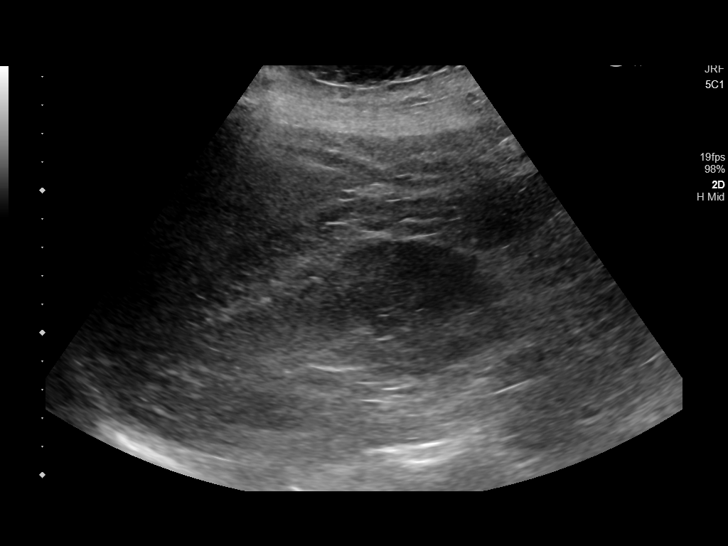
[im 88/117]
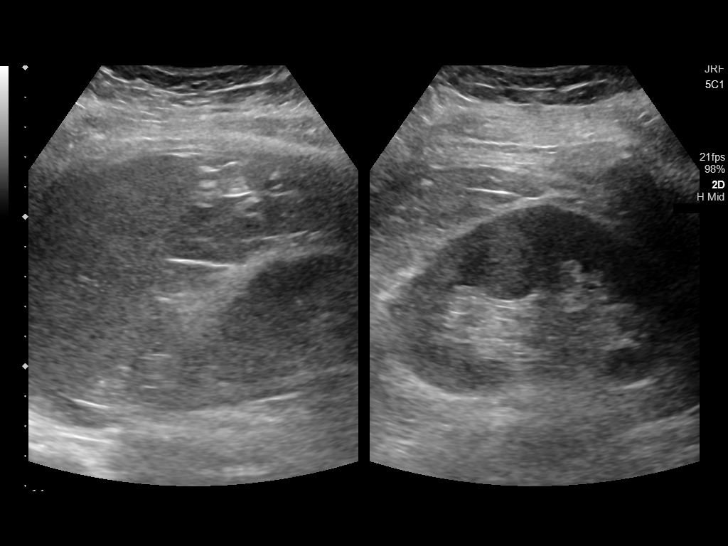
[im 97/117]
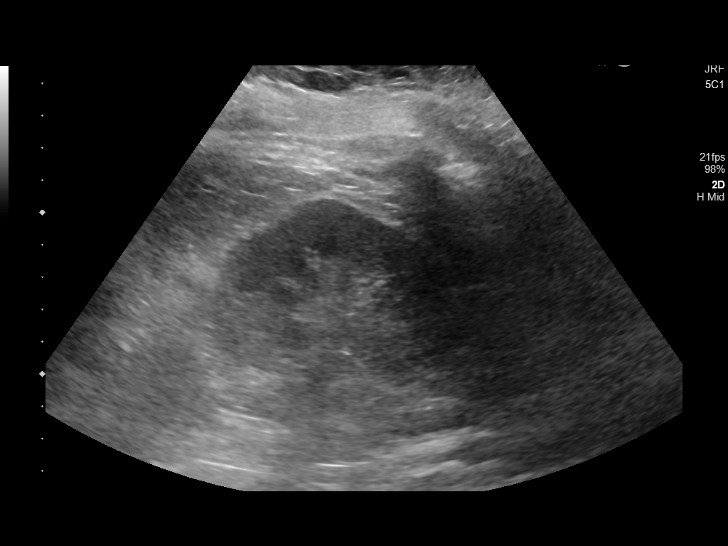
[im 107/117]
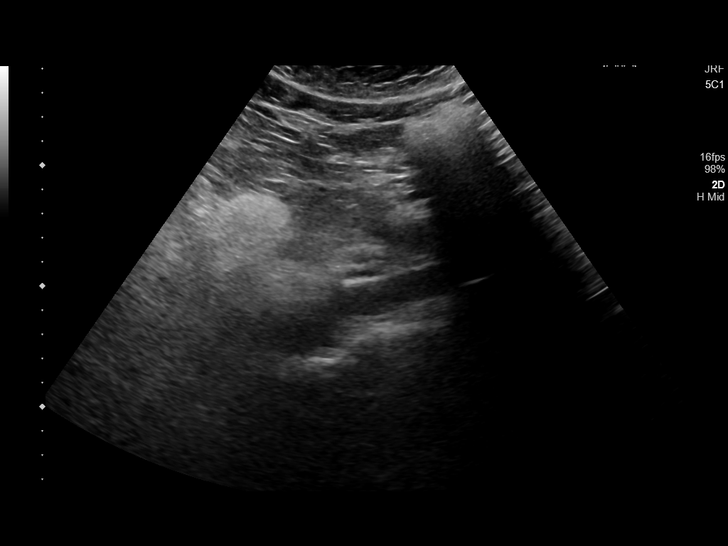
[im 117/117]
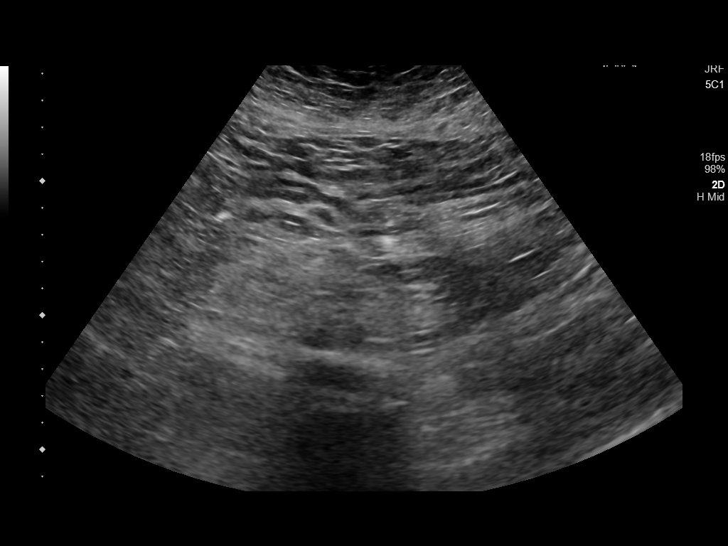

[13 of 25 positions shown; findings below may reference images not displayed]

FINDINGS: Gallbladder: The gallbladder is adequately distended. There is a
single echogenic mobile shadowing 1 cm gallstone. There is no
gallbladder wall thickening, pericholecystic fluid, or positive
sonographic Murphy's sign.

Common bile duct: Diameter: 2.5 mm

Liver: The hepatic echotexture is increased. The surface contour is
smooth. There is no focal mass or ductal dilation. Portal vein is
patent on color Doppler imaging with normal direction of blood flow
towards the liver.

IVC: No abnormality visualized.

Pancreas: Bowel gas limits evaluation of portions of the pancreatic
head and tail. The pancreatic body is grossly normal.

Spleen: Size and appearance within normal limits.

Right Kidney: Length: 10.6 cm. Echogenicity within normal limits. No
mass or hydronephrosis visualized.

Left Kidney: Length: 10.5 cm. Echogenicity within normal limits. No
mass or hydronephrosis visualized.

Abdominal aorta: No aneurysm visualized.

Other findings: None.
IMPRESSION: Gallstones without sonographic evidence of acute cholecystitis.

Increased hepatic echotexture most compatible with fatty
infiltrative change. Limited visualization of the pancreas due to
bowel gas.

## 2019-11-23 ENCOUNTER — Encounter: Payer: Self-pay | Admitting: Internal Medicine

## 2019-11-23 ENCOUNTER — Other Ambulatory Visit: Payer: Self-pay

## 2019-11-23 ENCOUNTER — Ambulatory Visit: Payer: 59 | Admitting: Internal Medicine

## 2019-11-23 VITALS — BP 126/76 | HR 74 | Temp 98.1°F | Ht 63.0 in | Wt 193.6 lb

## 2019-11-23 DIAGNOSIS — L570 Actinic keratosis: Secondary | ICD-10-CM | POA: Diagnosis not present

## 2019-11-23 DIAGNOSIS — I10 Essential (primary) hypertension: Secondary | ICD-10-CM | POA: Diagnosis not present

## 2019-11-23 DIAGNOSIS — L719 Rosacea, unspecified: Secondary | ICD-10-CM

## 2019-11-23 DIAGNOSIS — Z1283 Encounter for screening for malignant neoplasm of skin: Secondary | ICD-10-CM

## 2019-11-23 DIAGNOSIS — R7303 Prediabetes: Secondary | ICD-10-CM | POA: Diagnosis not present

## 2019-11-23 DIAGNOSIS — Z1211 Encounter for screening for malignant neoplasm of colon: Secondary | ICD-10-CM

## 2019-11-23 LAB — COMPREHENSIVE METABOLIC PANEL
ALT: 19 U/L (ref 0–35)
AST: 19 U/L (ref 0–37)
Albumin: 4 g/dL (ref 3.5–5.2)
Alkaline Phosphatase: 64 U/L (ref 39–117)
BUN: 18 mg/dL (ref 6–23)
CO2: 28 mEq/L (ref 19–32)
Calcium: 9 mg/dL (ref 8.4–10.5)
Chloride: 105 mEq/L (ref 96–112)
Creatinine, Ser: 0.82 mg/dL (ref 0.40–1.20)
GFR: 71.1 mL/min (ref >60.00)
Glucose, Bld: 95 mg/dL (ref 70–99)
Potassium: 4 mEq/L (ref 3.5–5.1)
Sodium: 139 mEq/L (ref 135–145)
Total Bilirubin: 0.4 mg/dL (ref 0.2–1.2)
Total Protein: 7.1 g/dL (ref 6.0–8.3)

## 2019-11-23 LAB — CBC WITH DIFFERENTIAL/PLATELET
Basophils Absolute: 0 10*3/uL (ref 0.0–0.1)
Basophils Relative: 0.5 % (ref 0.0–3.0)
Eosinophils Absolute: 0.2 10*3/uL (ref 0.0–0.7)
Eosinophils Relative: 3.6 % (ref 0.0–5.0)
HCT: 42.9 % (ref 36.0–46.0)
Hemoglobin: 14.3 g/dL (ref 12.0–15.0)
Lymphocytes Relative: 36.5 % (ref 12.0–46.0)
Lymphs Abs: 1.7 10*3/uL (ref 0.7–4.0)
MCHC: 33.3 g/dL (ref 30.0–36.0)
MCV: 89.8 fl (ref 78.0–100.0)
Monocytes Absolute: 0.3 10*3/uL (ref 0.1–1.0)
Monocytes Relative: 7.3 % (ref 3.0–12.0)
Neutro Abs: 2.5 10*3/uL (ref 1.4–7.7)
Neutrophils Relative %: 52.1 % (ref 43.0–77.0)
Platelets: 209 10*3/uL (ref 150.0–400.0)
RBC: 4.77 Mil/uL (ref 3.87–5.11)
RDW: 13.5 % (ref 11.5–15.5)
WBC: 4.7 10*3/uL (ref 4.0–10.5)

## 2019-11-23 LAB — LIPID PANEL
Cholesterol: 163 mg/dL (ref 0–200)
HDL: 53.4 mg/dL (ref >39.00)
LDL Cholesterol: 88 mg/dL (ref 0–99)
NonHDL: 109.89
Total CHOL/HDL Ratio: 3
Triglycerides: 107 mg/dL (ref 0.0–149.0)
VLDL: 21.4 mg/dL (ref 0.0–40.0)

## 2019-11-23 LAB — HEMOGLOBIN A1C: Hgb A1c MFr Bld: 5.4 % (ref 4.6–6.5)

## 2019-11-23 NOTE — Patient Instructions (Signed)
Consider Tdap and shingrix vaccines in future can cause and schedule nurse visit   Zoster Vaccine, Recombinant injection What is this medicine? ZOSTER VACCINE (ZOS ter vak SEEN) is used to prevent shingles in adults 60 years old and over. This vaccine is not used to treat shingles or nerve pain from shingles. This medicine may be used for other purposes; ask your health care provider or pharmacist if you have questions. COMMON BRAND NAME(S): [facility] What should I tell my health care provider before I take this medicine? They need to know if you have any of these conditions:  blood disorders or disease  cancer like leukemia or lymphoma  immune system problems or therapy  an unusual or allergic reaction to vaccines, other medications, foods, dyes, or preservatives  pregnant or trying to get pregnant  breast-feeding How should I use this medicine? This vaccine is for injection in a muscle. It is given by a health care professional. Talk to your pediatrician regarding the use of this medicine in children. This medicine is not approved for use in children. Overdosage: If you think you have taken too much of this medicine contact a poison control center or emergency room at once. NOTE: This medicine is only for you. Do not share this medicine with others. What if I miss a dose? Keep appointments for follow-up (booster) doses as directed. It is important not to miss your dose. Call your doctor or health care professional if you are unable to keep an appointment. What may interact with this medicine?  medicines that suppress your immune system  medicines to treat cancer  steroid medicines like prednisone or cortisone This list may not describe all possible interactions. Give your health care provider a list of all the medicines, herbs, non-prescription drugs, or dietary supplements you use. Also tell them if you smoke, drink alcohol, or use illegal drugs. Some items may interact with your  medicine. What should I watch for while using this medicine? Visit your doctor for regular check ups. This vaccine, like all vaccines, may not fully protect everyone. What side effects may I notice from receiving this medicine? Side effects that you should report to your doctor or health care professional as soon as possible:  allergic reactions like skin rash, itching or hives, swelling of the face, lips, or tongue  breathing problems Side effects that usually do not require medical attention (report these to your doctor or health care professional if they continue or are bothersome):  chills  headache  fever  nausea, vomiting  redness, warmth, pain, swelling or itching at site where injected  tiredness This list may not describe all possible side effects. Call your doctor for medical advice about side effects. You may report side effects to FDA at 1-800-FDA-1088. Where should I keep my medicine? This vaccine is only given in a clinic, pharmacy, doctor's office, or other health care setting and will not be stored at home. NOTE: This sheet is a summary. It may not cover all possible information. If you have questions about this medicine, talk to your doctor, pharmacist, or health care provider.  2020 Elsevier/Gold Standard (2017-03-01 13:20:30)  https://www.cdc.gov/vaccines/hcp/vis/vis-statements/tdap.pdf">  Tdap (Tetanus, Diphtheria, Pertussis) Vaccine: What You Need to Know 1. Why get vaccinated? Tdap vaccine can prevent tetanus, diphtheria, and pertussis. Diphtheria and pertussis spread from person to person. Tetanus enters the body through cuts or wounds.  TETANUS (T) causes painful stiffening of the muscles. Tetanus can lead to serious health problems, including being unable to open  the mouth, having trouble swallowing and breathing, or death.  DIPHTHERIA (D) can lead to difficulty breathing, heart failure, paralysis, or death.  PERTUSSIS (aP), also known as "whooping  cough," can cause uncontrollable, violent coughing which makes it hard to breathe, eat, or drink. Pertussis can be extremely serious in babies and young children, causing pneumonia, convulsions, brain damage, or death. In teens and adults, it can cause weight loss, loss of bladder control, passing out, and rib fractures from severe coughing. 2. Tdap vaccine Tdap is only for children 7 years and older, adolescents, and adults.  Adolescents should receive a single dose of Tdap, preferably at age 80 or 12 years. Pregnant women should get a dose of Tdap during every pregnancy, to protect the newborn from pertussis. Infants are most at risk for severe, life-threatening complications from pertussis. Adults who have never received Tdap should get a dose of Tdap. Also, adults should receive a booster dose every 10 years, or earlier in the case of a severe and dirty wound or burn. Booster doses can be either Tdap or Td (a different vaccine that protects against tetanus and diphtheria but not pertussis). Tdap may be given at the same time as other vaccines. 3. Talk with your health care provider Tell your vaccine provider if the person getting the vaccine:  Has had an allergic reaction after a previous dose of any vaccine that protects against tetanus, diphtheria, or pertussis, or has any severe, life-threatening allergies.  Has had a coma, decreased level of consciousness, or prolonged seizures within 7 days after a previous dose of any pertussis vaccine (DTP, DTaP, or Tdap).  Has seizures or another nervous system problem.  Has ever had Guillain-Barr Syndrome (also called GBS).  Has had severe pain or swelling after a previous dose of any vaccine that protects against tetanus or diphtheria. In some cases, your health care provider may decide to postpone Tdap vaccination to a future visit.  People with minor illnesses, such as a cold, may be vaccinated. People who are moderately or severely ill should  usually wait until they recover before getting Tdap vaccine.  Your health care provider can give you more information. 4. Risks of a vaccine reaction  Pain, redness, or swelling where the shot was given, mild fever, headache, feeling tired, and nausea, vomiting, diarrhea, or stomachache sometimes happen after Tdap vaccine. People sometimes faint after medical procedures, including vaccination. Tell your provider if you feel dizzy or have vision changes or ringing in the ears.  As with any medicine, there is a very remote chance of a vaccine causing a severe allergic reaction, other serious injury, or death. 5. What if there is a serious problem? An allergic reaction could occur after the vaccinated person leaves the clinic. If you see signs of a severe allergic reaction (hives, swelling of the face and throat, difficulty breathing, a fast heartbeat, dizziness, or weakness), call 9-1-1 and get the person to the nearest hospital. For other signs that concern you, call your health care provider.  Adverse reactions should be reported to the Vaccine Adverse Event Reporting System (VAERS). Your health care provider will usually file this report, or you can do it yourself. Visit the VAERS website at www.vaers.LAgents.no or call 563-686-1818. VAERS is only for reporting reactions, and VAERS staff do not give medical advice. 6. The National Vaccine Injury Compensation Program The National Vaccine Injury Compensation Program (VICP) is a federal program that was created to compensate people who may have been injured by certain  vaccines. Visit the VICP website at GoldCloset.com.ee or call 782-209-9493 to learn about the program and about filing a claim. There is a time limit to file a claim for compensation. 7. How can I learn more?  Ask your health care provider.  Call your local or state health department.  Contact the Centers for Disease Control and Prevention (CDC): ? Call 385-133-1608  (1-800-CDC-INFO) or ? Visit CDC's website at http://hunter.com/ Vaccine Information Statement Tdap (Tetanus, Diphtheria, Pertussis) Vaccine (11/02/2018) This information is not intended to replace advice given to you by your health care provider. Make sure you discuss any questions you have with your health care provider. Document Revised: 11/11/2018 Document Reviewed: 11/14/2018 Elsevier Patient Education  [place].

## 2019-11-23 NOTE — Progress Notes (Signed)
Chief Complaint  Patient presents with  . Follow-up  . Referral    pt states she is due for a colonoscopy and will need referral to GI   F/u  1. HTN on losartan 50 mg qd and hctz 12.5 mg qd  2. Rosacea and Ak changes to face will refer Dr. Nicholas Lose in [place]  3. Wants colonoscopy 02/2020  Review of Systems  Constitutional: Positive for weight loss.  HENT: Negative for hearing loss.   Eyes: Negative for blurred vision.  Respiratory: Negative for shortness of breath.   Cardiovascular: Negative for chest pain.  Gastrointestinal: Negative for abdominal pain and blood in stool.  Musculoskeletal: Negative for falls.  Skin: Positive for rash.  Psychiatric/Behavioral: Negative for depression and memory loss.   Past Medical History:  Diagnosis Date  . Allergic rhinitis   . Chicken pox   . GERD (gastroesophageal reflux disease)   . Hypertension    Past Surgical History:  Procedure Laterality Date  . ABDOMINAL HYSTERECTOMY  2009  . CESAREAN SECTION  [phone], 2002  . OOPHORECTOMY     Family History  Problem Relation Age of Onset  . Drug abuse Mother   . Heart disease Mother   . Hypertension Mother   . Alcohol abuse Father   . COPD Father   . Sudden Cardiac Death Maternal Grandmother   . Heart disease Maternal Grandmother    Social History   Socioeconomic History  . Marital status: Married    Spouse name: Not on file  . Number of children: Not on file  . Years of education: Not on file  . Highest education level: Not on file  Occupational History  . Not on file  Tobacco Use  . Smoking status: Never Smoker  . Smokeless tobacco: Never Used  Substance and Sexual Activity  . Alcohol use: No    Alcohol/week: 0.0 standard drinks  . Drug use: No  . Sexual activity: Yes    Partners: Male    Birth control/protection: Post-menopausal  Other Topics Concern  . 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 Difficulty of Paying Living Expenses:   Food Insecurity:   . Worried About Programme researcher, broadcasting/film/video in the Last Year:   . Barista in the Last Year:   Transportation Needs:   . Freight forwarder (Medical):   Marland Kitchen Lack of Transportation (Non-Medical):   Physical Activity:   . Days of Exercise per Week:   . Minutes of Exercise per Session:   Stress:   . Feeling of Stress :   Social Connections:   . Frequency of Communication with Friends and Family:   . Frequency of Social Gatherings with Friends and Family:   . Attends Religious Services:   . Active Member of Clubs or Organizations:   . Attends Banker Meetings:   Marland Kitchen Marital Status:   Intimate Partner Violence:   . Fear of Current or Ex-Partner:   . Emotionally Abused:   Marland Kitchen Physically Abused:   . Sexually Abused:    Current Meds  Medication Sig  . hydrochlorothiazide (MICROZIDE) 12.5 MG capsule Take 1 capsule (12.5 mg total) by mouth every morning.  Marland Kitchen losartan (COZAAR) 50 MG tablet Take 1 tablet (50 mg total) by mouth daily.  . metroNIDAZOLE (  METROGEL) 1 % gel Apply topically daily.   No Known Allergies Recent Results (from the past 2160 hour(s))  T3, free     Status: None   Collection Time: 09/04/19  9:43 AM  Result Value Ref Range   T3, Free 3.3 2.3 - 4.2 pg/mL  Thyroid peroxidase antibody     Status: None   Collection Time: 09/04/19  9:43 AM  Result Value Ref Range   Thyroperoxidase Ab SerPl-aCnc 2 <9 IU/mL  T4, free     Status: None   Collection Time: 09/04/19  9:43 AM  Result Value Ref Range   Free T4 0.85 0.60 - 1.60 ng/dL    Comment: Specimens from patients who are undergoing biotin therapy and /or ingesting biotin supplements may contain high levels of biotin.  The higher biotin concentration in these specimens interferes with this Free T4 assay.  Specimens that contain high levels  of biotin may cause false high results for  this Free T4 assay.  Please interpret results in light of the total clinical presentation of the patient.    TSH     Status: None   Collection Time: 09/04/19  3:12 PM  Result Value Ref Range   TSH 3.53 0.35 - 4.50 uIU/mL   Objective  Body mass index is 34.29 kg/m. Wt Readings from Last 3 Encounters:  11/23/19 193 lb 9.6 oz (87.8 kg)  05/24/19 208 lb 12.8 oz (94.7 kg)  10/26/17 209 lb 12.8 oz (95.2 kg)   Temp Readings from Last 3 Encounters:  11/23/19 98.1 F (36.7 C) (Temporal)  05/24/19 98 F (36.7 C) (Skin)  10/26/17 97.6 F (36.4 C) (Oral)   BP Readings from Last 3 Encounters:  11/23/19 126/76  05/24/19 116/76  10/26/17 128/80   Pulse Readings from Last 3 Encounters:  11/23/19 74  05/24/19 67  10/26/17 76    Physical Exam Vitals and nursing note reviewed.  Constitutional:      Appearance: Normal appearance. She is well-developed and well-groomed. She is obese.  HENT:     Head: Normocephalic and atraumatic.  Eyes:     Conjunctiva/sclera: Conjunctivae normal.     Pupils: Pupils are equal, round, and reactive to light.  Cardiovascular:     Rate and Rhythm: Normal rate and regular rhythm.     Heart sounds: Normal heart sounds. No murmur.  Pulmonary:     Effort: Pulmonary effort is normal.     Breath sounds: Normal breath sounds.  Abdominal:     General: Abdomen is flat. Bowel sounds are normal.     Tenderness: There is no abdominal tenderness.  Skin:    General: Skin is warm and dry.  Neurological:     General: No focal deficit present.     Mental Status: She is alert and oriented to person, place, and time. Mental status is at baseline.     Gait: Gait normal.  Psychiatric:        Attention and Perception: Attention and perception normal.        Mood and Affect: Mood and affect normal.        Speech: Speech normal.        Behavior: Behavior normal. Behavior is cooperative.        Thought Content: Thought content normal.        Cognition and Memory:  Cognition and memory normal.        Judgment: Judgment normal.     Assessment  Plan  Essential hypertension - Plan: Comprehensive metabolic  panel, Lipid panel, CBC with Differential/Platelet Cont meds controlled   Prediabetes - Plan: Hemoglobin A1c  AK (actinic keratosis) - Plan: Ambulatory referral to Dermatology  Skin cancer screening - Plan: Ambulatory referral to Dermatology  Rosacea - Plan: Ambulatory referral to Dermatology  Encounter for screening colonoscopy - Plan: Ambulatory referral to Gastroenterology   HM Fasting labs today Declines flu shot  2/2/ covid vaccines  Consider check hep A/B in future Given info shingrix and disc Tdap today declines for now will consider in future and Rx   mammo neg 04/19/17 ordered mammogram today [facility]  Get [facility] records 04/2017 s/p total hysterectomy for ovarian cyst but per pt had pap 04/2017 cervix out never abnormal  -saw [facility] 02/23/17 notes no pap attached will request -pt will call and schedule f/u [facility] disc [facility] Erp or Dr. Bonney Aid  Colonoscopy hold until 02/2020 referred [facility]  rec healthy diet and exercise   Skin no issues other than Aks race rosacea Plan: metroNIDAZOLE (METROGEL) 1 % gel Referred Dr. Nicholas Lose    Provider: Dr. French Ana McLean-Scocuzza-Internal Medicine

## 2019-12-08 LAB — HM MAMMOGRAPHY

## 2019-12-19 ENCOUNTER — Encounter: Payer: Self-pay | Admitting: Internal Medicine

## 2020-04-25 ENCOUNTER — Other Ambulatory Visit: Payer: Self-pay | Admitting: Internal Medicine

## 2020-04-25 DIAGNOSIS — I10 Essential (primary) hypertension: Secondary | ICD-10-CM

## 2020-04-25 MED ORDER — HYDROCHLOROTHIAZIDE 12.5 MG PO CAPS: 12.5000 mg | ORAL_CAPSULE | Freq: Every morning | ORAL | 3 refills | Status: DC

## 2020-07-09 ENCOUNTER — Other Ambulatory Visit: Payer: Self-pay

## 2020-07-11 ENCOUNTER — Ambulatory Visit: Payer: 59 | Admitting: Internal Medicine

## 2020-09-24 ENCOUNTER — Other Ambulatory Visit: Payer: Self-pay | Admitting: Internal Medicine

## 2020-09-24 DIAGNOSIS — I10 Essential (primary) hypertension: Secondary | ICD-10-CM

## 2020-10-08 ENCOUNTER — Other Ambulatory Visit: Payer: Self-pay

## 2020-10-08 ENCOUNTER — Encounter: Payer: Self-pay | Admitting: Internal Medicine

## 2020-10-08 ENCOUNTER — Ambulatory Visit: Payer: 59 | Admitting: Internal Medicine

## 2020-10-08 VITALS — BP 118/82 | HR 92 | Temp 97.6°F | Ht 63.0 in | Wt 203.0 lb

## 2020-10-08 DIAGNOSIS — Z23 Encounter for immunization: Secondary | ICD-10-CM

## 2020-10-08 DIAGNOSIS — K760 Fatty (change of) liver, not elsewhere classified: Secondary | ICD-10-CM

## 2020-10-08 DIAGNOSIS — Z1231 Encounter for screening mammogram for malignant neoplasm of breast: Secondary | ICD-10-CM

## 2020-10-08 DIAGNOSIS — L719 Rosacea, unspecified: Secondary | ICD-10-CM

## 2020-10-08 DIAGNOSIS — Z0000 Encounter for general adult medical examination without abnormal findings: Secondary | ICD-10-CM

## 2020-10-08 DIAGNOSIS — Z0184 Encounter for antibody response examination: Secondary | ICD-10-CM

## 2020-10-08 DIAGNOSIS — R739 Hyperglycemia, unspecified: Secondary | ICD-10-CM

## 2020-10-08 DIAGNOSIS — R921 Mammographic calcification found on diagnostic imaging of breast: Secondary | ICD-10-CM

## 2020-10-08 DIAGNOSIS — I10 Essential (primary) hypertension: Secondary | ICD-10-CM | POA: Diagnosis not present

## 2020-10-08 DIAGNOSIS — Z1211 Encounter for screening for malignant neoplasm of colon: Secondary | ICD-10-CM | POA: Diagnosis not present

## 2020-10-08 DIAGNOSIS — Z1329 Encounter for screening for other suspected endocrine disorder: Secondary | ICD-10-CM

## 2020-10-08 DIAGNOSIS — E559 Vitamin D deficiency, unspecified: Secondary | ICD-10-CM

## 2020-10-08 DIAGNOSIS — Z1389 Encounter for screening for other disorder: Secondary | ICD-10-CM

## 2020-10-08 LAB — TSH: TSH: 4.08 u[IU]/mL (ref 0.35–4.50)

## 2020-10-08 LAB — COMPREHENSIVE METABOLIC PANEL
ALT: 34 U/L (ref 0–35)
AST: 25 U/L (ref 0–37)
Albumin: 4.2 g/dL (ref 3.5–5.2)
Alkaline Phosphatase: 70 U/L (ref 39–117)
BUN: 12 mg/dL (ref 6–23)
CO2: 28 mEq/L (ref 19–32)
Calcium: 9.4 mg/dL (ref 8.4–10.5)
Chloride: 102 mEq/L (ref 96–112)
Creatinine, Ser: 0.85 mg/dL (ref 0.40–1.20)
GFR: 74.2 mL/min (ref >60.00)
Glucose, Bld: 100 mg/dL — ABNORMAL HIGH (ref 70–99)
Potassium: 3.8 mEq/L (ref 3.5–5.1)
Sodium: 137 mEq/L (ref 135–145)
Total Bilirubin: 0.7 mg/dL (ref 0.2–1.2)
Total Protein: 7.2 g/dL (ref 6.0–8.3)

## 2020-10-08 LAB — LIPID PANEL
Cholesterol: 169 mg/dL (ref 0–200)
HDL: 51.1 mg/dL (ref >39.00)
LDL Cholesterol: 94 mg/dL (ref 0–99)
NonHDL: 117.4
Total CHOL/HDL Ratio: 3
Triglycerides: 119 mg/dL (ref 0.0–149.0)
VLDL: 23.8 mg/dL (ref 0.0–40.0)

## 2020-10-08 LAB — CBC WITH DIFFERENTIAL/PLATELET
Basophils Absolute: 0 10*3/uL (ref 0.0–0.1)
Basophils Relative: 0.7 % (ref 0.0–3.0)
Eosinophils Absolute: 0.1 10*3/uL (ref 0.0–0.7)
Eosinophils Relative: 2.1 % (ref 0.0–5.0)
HCT: 44.2 % (ref 36.0–46.0)
Hemoglobin: 15.2 g/dL — ABNORMAL HIGH (ref 12.0–15.0)
Lymphocytes Relative: 38.6 % (ref 12.0–46.0)
Lymphs Abs: 1.8 10*3/uL (ref 0.7–4.0)
MCHC: 34.3 g/dL (ref 30.0–36.0)
MCV: 88.4 fl (ref 78.0–100.0)
Monocytes Absolute: 0.4 10*3/uL (ref 0.1–1.0)
Monocytes Relative: 8.5 % (ref 3.0–12.0)
Neutro Abs: 2.4 10*3/uL (ref 1.4–7.7)
Neutrophils Relative %: 50.1 % (ref 43.0–77.0)
Platelets: 236 10*3/uL (ref 150.0–400.0)
RBC: 5 Mil/uL (ref 3.87–5.11)
RDW: 13.6 % (ref 11.5–15.5)
WBC: 4.7 10*3/uL (ref 4.0–10.5)

## 2020-10-08 LAB — VITAMIN D 25 HYDROXY (VIT D DEFICIENCY, FRACTURES): VITD: 33.74 ng/mL (ref 30.00–100.00)

## 2020-10-08 LAB — HEMOGLOBIN A1C: Hgb A1c MFr Bld: 5.7 % (ref 4.6–6.5)

## 2020-10-08 MED ORDER — METRONIDAZOLE 1 % EX GEL: Freq: Every day | CUTANEOUS | 12 refills | Status: AC

## 2020-10-08 MED ORDER — HYDROCHLOROTHIAZIDE 12.5 MG PO TABS: 12.5000 mg | ORAL_TABLET | Freq: Every day | ORAL | 3 refills | Status: DC

## 2020-10-08 MED ORDER — LOSARTAN POTASSIUM 50 MG PO TABS: 75.0000 mg | ORAL_TABLET | Freq: Every day | ORAL | 3 refills | Status: DC

## 2020-10-08 NOTE — Progress Notes (Addendum)
Chief Complaint  Patient presents with  . Follow-up    F/u on meds, pt has adjusted medications and that has helped with weight gain and controlling higher blood pressures. Still exercising. Has questions on A1C.   Annual  1.htn taking losartan 75 mg qd and hctz 12.5 mg qd  She has gained wt and BP elevating with this but 100 mg losartan too much  2. Rosacea improved with metrogel needs refill  Review of Systems  Constitutional: Negative for weight loss.  HENT: Negative for hearing loss.   Eyes: Negative for blurred vision.  Respiratory: Negative for shortness of breath.   Cardiovascular: Negative for chest pain.  Gastrointestinal: Negative for abdominal pain.  Musculoskeletal: Negative for falls.  Skin: Negative for rash.  Neurological: Negative for headaches.  Psychiatric/Behavioral: Negative for depression.   Past Medical History:  Diagnosis Date  . Allergic rhinitis   . Chicken pox   . COVID-19    mid 08/16/2020 fever/congestion/loss smell/taste 48 hrs  . GERD (gastroesophageal reflux disease)   . Hypertension    Past Surgical History:  Procedure Laterality Date  . ABDOMINAL HYSTERECTOMY  2009  . CESAREAN SECTION  [phone], 2002  . OOPHORECTOMY     Family History  Problem Relation Age of Onset  . Drug abuse Mother   . Heart disease Mother   . Hypertension Mother   . Alcohol abuse Father   . COPD Father   . Sudden Cardiac Death Maternal Grandmother   . Heart disease Maternal Grandmother    Social History   Socioeconomic History  . Marital status: Married    Spouse name: Not on file  . Number of children: Not on file  . Years of education: Not on file  . Highest education level: Not on file  Occupational History  . Not on file  Tobacco Use  . Smoking status: Never Smoker  . Smokeless tobacco: Never Used  Vaping Use  . Vaping Use: Never used  Substance and Sexual Activity  . Alcohol use: No    Alcohol/week: 0.0 standard drinks  . Drug use: No  .  Sexual activity: Yes    Partners: Male    Birth control/protection: Post-menopausal  Other Topics Concern  . 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 hydrochlorothiazide (HYDRODIURIL) 12.5 MG tablet Take 1 tablet (12.5 mg total) by mouth daily. In am  . [DISCONTINUED] hydrochlorothiazide (MICROZIDE) 12.5 MG capsule Take 1 capsule (12.5 mg total) by mouth every morning.  . [DISCONTINUED] losartan (COZAAR) 50 MG tablet TAKE ONE TABLET BY MOUTH EVERY DAY  . [DISCONTINUED] metroNIDAZOLE (METROGEL) 1 % gel Apply topically daily.   No Known Allergies No results found for this or any previous visit (from the past 2160 hour(s)). Objective  Body mass index is 35.96 kg/m. Wt Readings from Last 3 Encounters:  10/08/20 203 lb (92.1 kg)  11/23/19 193 lb 9.6 oz (87.8 kg)  05/24/19 208 lb 12.8 oz (94.7 kg)  Temp Readings from Last 3 Encounters:  10/08/20 97.6 F (36.4 C)  11/23/19 98.1 F (36.7 C) (Temporal)  05/24/19 98 F (36.7 C) (Skin)   BP Readings from Last 3 Encounters:  10/08/20 118/82  11/23/19 126/76  05/24/19 116/76   Pulse Readings from Last 3 Encounters:  10/08/20 92  11/23/19 74  05/24/19 67    Physical Exam Vitals and nursing note reviewed.  Constitutional:      Appearance: Normal appearance. She is well-developed and well-groomed. She is obese.  HENT:     Head: Normocephalic and atraumatic.  Eyes:     Conjunctiva/sclera: Conjunctivae normal.     Pupils: Pupils are equal, round, and reactive to light.  Cardiovascular:      Rate and Rhythm: Normal rate and regular rhythm.     Heart sounds: Normal heart sounds. No murmur heard.   Pulmonary:     Effort: Pulmonary effort is normal.     Breath sounds: Normal breath sounds.  Abdominal:     General: Abdomen is flat. Bowel sounds are normal.     Tenderness: There is no abdominal tenderness.  Skin:    General: Skin is warm and dry.  Neurological:     General: No focal deficit present.     Mental Status: She is alert and oriented to person, place, and time. Mental status is at baseline.     Gait: Gait normal.  Psychiatric:        Attention and Perception: Attention and perception normal.        Mood and Affect: Mood and affect normal.        Speech: Speech normal.        Behavior: Behavior normal. Behavior is cooperative.        Thought Content: Thought content normal.        Cognition and Memory: Cognition and memory normal.        Judgment: Judgment normal.     Assessment  Plan  Annual physical exam Fasting labs today Declines flu shot Tdap given today left arm 2/2/ covid vaccines declines 3rd dose had covid 08/2020 Consider check hep A/B in future Given info shingrix declined in past for now will consider in future and Rx   m[facility]7/21[facility] cluster round punctate round calcifications left breast benign f/u 6 months [facility]  Consider DEXA age 22   Get [facility] records 04/2017 s/p total hysterectomy for ovarian cyst but per pt had pap 04/2017 cervix out never abnormal  -saw [facility] 02/23/17 notes no pap attached will request -pt will call and schedule f/u [facility] disc [facility] Erp or Dr. Bonney Aid  Colonoscopyholduntil 01/2021 referred [facility]  rec healthy diet and exercise   Skin no issues other than Aks race rosaceaPlan: metroNIDAZOLE (METROGEL) 1 % gel, topical chemo cream sch Dr. Nicholas Lose 01/2022 tbse due  Essential hypertension - Plan: losartan (COZAAR) 75 MG tablet,  hydrochlorothiazide (HYDRODIURIL) 12.5 MG tablet, Lipid panel, CBC with Differential/Platelet, Comprehensive metabolic panel  Rosacea - Plan: metroNIDAZOLE (METROGEL) 1 % gel    Provider: Dr. French Ana McLean-Scocuzza-Internal Medicine

## 2020-10-08 NOTE — Patient Instructions (Addendum)
12/07/20 mammo due [facility]  [facility] call for appt 01/2021 colonoscopy  [address] [phone]  Tdap (Tetanus, Diphtheria, Pertussis) Vaccine: What You Need to Know 1. Why get vaccinated? Tdap vaccine can prevent tetanus, diphtheria, and pertussis. Diphtheria and pertussis spread from person to person. Tetanus enters the body through cuts or wounds.  TETANUS (T) causes painful stiffening of the muscles. Tetanus can lead to serious health problems, including being unable to open the mouth, having trouble swallowing and breathing, or death.  DIPHTHERIA (D) can lead to difficulty breathing, heart failure, paralysis, or death.  PERTUSSIS (aP), also known as "whooping cough," can cause uncontrollable, violent coughing that makes it hard to breathe, eat, or drink. Pertussis can be extremely serious especially in babies and young children, causing pneumonia, convulsions, brain damage, or death. In teens and adults, it can cause weight loss, loss of bladder control, passing out, and rib fractures from severe coughing. 2. Tdap vaccine Tdap is only for children 7 years and older, adolescents, and adults.  Adolescents should receive a single dose of Tdap, preferably at age 64 or 12 years. Pregnant people should get a dose of Tdap during every pregnancy, preferably during the early part of the third trimester, to help protect the newborn from pertussis. Infants are most at risk for severe, life-threatening complications from pertussis. Adults who have never received Tdap should get a dose of Tdap. Also, adults should receive a booster dose of either Tdap or Td (a different vaccine that protects against tetanus and diphtheria but not pertussis) every 10 years, or after 5 years in the case of a severe or dirty wound or burn. Tdap may be given at the same time as other vaccines. 3. Talk with your health care provider Tell your vaccine provider if the person getting the vaccine:  Has had an allergic reaction  after a previous dose of any vaccine that protects against tetanus, diphtheria, or pertussis, or has any severe, life-threatening allergies  Has had a coma, decreased level of consciousness, or prolonged seizures within 7 days after a previous dose of any pertussis vaccine (DTP, DTaP, or Tdap)  Has seizures or another nervous system problem  Has ever had Guillain-Barr Syndrome (also called "GBS")  Has had severe pain or swelling after a previous dose of any vaccine that protects against tetanus or diphtheria In some cases, your health care provider may decide to postpone Tdap vaccination until a future visit. People with minor illnesses, such as a cold, may be vaccinated. People who are moderately or severely ill should usually wait until they recover before getting Tdap vaccine.  Your health care provider can give you more information. 4. Risks of a vaccine reaction  Pain, redness, or swelling where the shot was given, mild fever, headache, feeling tired, and nausea, vomiting, diarrhea, or stomachache sometimes happen after Tdap vaccination. People sometimes faint after medical procedures, including vaccination. Tell your provider if you feel dizzy or have vision changes or ringing in the ears.  As with any medicine, there is a very remote chance of a vaccine causing a severe allergic reaction, other serious injury, or death. 5. What if there is a serious problem? An allergic reaction could occur after the vaccinated person leaves the clinic. If you see signs of a severe allergic reaction (hives, swelling of the face and throat, difficulty breathing, a fast heartbeat, dizziness, or weakness), call 9-1-1 and get the person to the nearest hospital. For other signs that concern you, call your  health care provider.  Adverse reactions should be reported to the Vaccine Adverse Event Reporting System (VAERS). Your health care provider will usually file this report, or you can do it yourself. Visit the  VAERS website at www.vaers.LAgents.no or call 508-177-0856. VAERS is only for reporting reactions, and VAERS staff members do not give medical advice. 6. The National Vaccine Injury Compensation Program The [employer] Vaccine Injury Compensation Program (VICP) is a federal program that was created to compensate people who may have been injured by certain vaccines. Claims regarding alleged injury or death due to vaccination have a time limit for filing, which may be as short as two years. Visit the VICP website at SpiritualWord.at or call (929)339-7170 to learn about the program and about filing a claim. 7. How can I learn more?  Ask your health care provider.  Call your local or state health department.  Visit the website of the Food and Drug Administration (FDA) for vaccine package inserts and additional information at FinderList.no.  Contact the Centers for Disease Control and Prevention (CDC): ? Call 904-107-9352 (1-800-CDC-INFO) or ? Visit CDC's website at PicCapture.uy. Vaccine Information Statement Tdap (Tetanus, Diphtheria, Pertussis) Vaccine (03/08/2020) This information is not intended to replace advice given to you by your health care provider. Make sure you discuss any questions you have with your health care provider. Document Revised: 04/03/2020 Document Reviewed: 04/03/2020 Elsevier Patient Education  2021 [employer].

## 2020-10-08 NOTE — Addendum Note (Signed)
Addended by: Sandy Salaam on: 10/08/2020 11:06 AM   Modules accepted: Orders

## 2020-10-09 ENCOUNTER — Telehealth: Payer: Self-pay | Admitting: Internal Medicine

## 2020-10-09 LAB — URINALYSIS, ROUTINE W REFLEX MICROSCOPIC
Bilirubin, UA: NEGATIVE
Glucose, UA: NEGATIVE
Ketones, UA: NEGATIVE
Leukocytes,UA: NEGATIVE
Nitrite, UA: NEGATIVE
Protein,UA: NEGATIVE
RBC, UA: NEGATIVE
Specific Gravity, UA: 1.009 (ref 1.005–1.030)
Urobilinogen, Ur: 0.2 mg/dL (ref 0.2–1.0)
pH, UA: 7 (ref 5.0–7.5)

## 2020-10-09 LAB — HEPATITIS A ANTIBODY, TOTAL: Hepatitis A AB,Total: NONREACTIVE

## 2020-10-09 LAB — HEPATITIS B SURFACE ANTIBODY, QUANTITATIVE: Hep B S AB Quant (Post): <5 m[IU]/mL — ABNORMAL LOW (ref >=10)

## 2020-10-09 NOTE — Telephone Encounter (Signed)
Prior authorization has been submitted for patient's Losartan 50 mg tablets.   Awaiting approval or denial.

## 2020-10-10 ENCOUNTER — Telehealth: Payer: Self-pay

## 2020-10-10 NOTE — Telephone Encounter (Signed)
LMTCB fir lab results.

## 2020-10-11 ENCOUNTER — Other Ambulatory Visit: Payer: Self-pay | Admitting: Internal Medicine

## 2020-10-11 ENCOUNTER — Encounter: Payer: Self-pay | Admitting: Internal Medicine

## 2020-10-11 DIAGNOSIS — I10 Essential (primary) hypertension: Secondary | ICD-10-CM

## 2020-10-11 MED ORDER — LOSARTAN POTASSIUM 50 MG PO TABS
50.0000 mg | ORAL_TABLET | Freq: Two times a day (BID) | ORAL | 3 refills | Status: DC
Start: 2020-10-11 — End: 2021-10-12

## 2020-10-16 NOTE — Telephone Encounter (Signed)
This medication or product is on your plan's list of covered drugs. Prior authorization is not required at this time

## 2020-12-13 LAB — HM MAMMOGRAPHY

## 2020-12-17 ENCOUNTER — Encounter: Payer: Self-pay | Admitting: Internal Medicine

## 2020-12-17 ENCOUNTER — Telehealth: Payer: Self-pay | Admitting: Internal Medicine

## 2020-12-17 NOTE — Telephone Encounter (Signed)
Left breast calcifications rec diagnostic mammogram please call [facility] to schedule

## 2020-12-18 ENCOUNTER — Telehealth: Payer: Self-pay | Admitting: Internal Medicine

## 2020-12-18 NOTE — Telephone Encounter (Signed)
Faxed to [facility] mammography for recall screen ing on 12-16-20 to [phone]

## 2020-12-18 NOTE — Telephone Encounter (Signed)
Left message to return call 

## 2020-12-19 LAB — HM MAMMOGRAPHY: HM Mammogram: ABNORMAL — AB (ref 0–4)

## 2020-12-20 NOTE — Telephone Encounter (Signed)
Left message to return call. °Mychart message sent as well.  °

## 2020-12-23 ENCOUNTER — Encounter: Payer: Self-pay | Admitting: Internal Medicine

## 2020-12-23 DIAGNOSIS — R921 Mammographic calcification found on diagnostic imaging of breast: Secondary | ICD-10-CM | POA: Insufficient documentation

## 2020-12-23 NOTE — Addendum Note (Signed)
Addended by: Quentin Ore on: 12/23/2020 03:17 PM   Modules accepted: Orders

## 2020-12-23 NOTE — Telephone Encounter (Signed)
Order will be sent for left diagnostic mammogram [facility] in 6 months due 06/21/21 if pt can call and schedule

## 2020-12-25 NOTE — Telephone Encounter (Signed)
Patient was made aware by the breast center. See Patient reply below

## 2021-01-29 ENCOUNTER — Ambulatory Visit (AMBULATORY_SURGERY_CENTER): Payer: 59 | Admitting: *Deleted

## 2021-01-29 ENCOUNTER — Other Ambulatory Visit: Payer: Self-pay

## 2021-01-29 VITALS — Ht 63.0 in | Wt 205.0 lb

## 2021-01-29 DIAGNOSIS — Z1211 Encounter for screening for malignant neoplasm of colon: Secondary | ICD-10-CM

## 2021-01-29 MED ORDER — SUTAB 1479-225-188 MG PO TABS: 24.0000 | ORAL_TABLET | ORAL | 0 refills | Status: DC

## 2021-01-29 NOTE — Progress Notes (Signed)
No egg or soy allergy known to patient  No issues with past sedation with any surgeries or procedures Patient denies ever being told they had issues or difficulty with intubation  No FH of Malignant Hyperthermia No diet pills per patient No home 02 use per patient  No blood thinners per patient  Pt denies issues with constipation  No A fib or A flutter  EMMI video to pt or via MyChart  COVID 19 guidelines implemented in PV today with Pt and RN  Pt is fully vaccinated  for Covid   SUTAB Coupon given to pt in PV today , Code to Pharmacy and  NO PA's for preps discussed with pt In PV today  Discussed with pt there will be an out-of-pocket cost for prep and that varies from $0 to 70 dollars   Due to the COVID-19 pandemic we are asking patients to follow certain guidelines.  Pt aware of COVID protocols and [facility] guidelines   Pt verified name, DOB, address and insurance during PV today. Pt mailed instruction packet to included paper to complete and mail back to [facility] with addressed and stamped envelope, Emmi video, copy of consent form to read and not return, and instructions. SUTAB  coupon mailed in packet. PV completed over the phone. Pt encouraged to call with questions or issues.  My Chart instructions to pt as well

## 2021-02-12 ENCOUNTER — Encounter: Payer: 59 | Admitting: Internal Medicine

## 2021-02-26 ENCOUNTER — Encounter: Payer: 59 | Admitting: Internal Medicine

## 2021-04-29 ENCOUNTER — Other Ambulatory Visit: Payer: Self-pay | Admitting: Internal Medicine

## 2021-04-29 DIAGNOSIS — I10 Essential (primary) hypertension: Secondary | ICD-10-CM

## 2021-05-01 ENCOUNTER — Other Ambulatory Visit: Payer: Self-pay | Admitting: Internal Medicine

## 2021-05-01 DIAGNOSIS — I10 Essential (primary) hypertension: Secondary | ICD-10-CM

## 2021-05-02 ENCOUNTER — Other Ambulatory Visit: Payer: Self-pay

## 2021-05-02 DIAGNOSIS — I10 Essential (primary) hypertension: Secondary | ICD-10-CM

## 2021-05-02 MED ORDER — HYDROCHLOROTHIAZIDE 12.5 MG PO TABS: 12.5000 mg | ORAL_TABLET | Freq: Every day | ORAL | 1 refills | Status: DC

## 2021-05-15 ENCOUNTER — Other Ambulatory Visit: Payer: Self-pay | Admitting: Internal Medicine

## 2021-05-15 DIAGNOSIS — I10 Essential (primary) hypertension: Secondary | ICD-10-CM

## 2021-08-05 LAB — HM MAMMOGRAPHY

## 2021-08-06 ENCOUNTER — Encounter: Payer: Self-pay | Admitting: Internal Medicine

## 2021-10-11 ENCOUNTER — Other Ambulatory Visit: Payer: Self-pay | Admitting: Internal Medicine

## 2021-10-11 DIAGNOSIS — I10 Essential (primary) hypertension: Secondary | ICD-10-CM

## 2021-10-14 ENCOUNTER — Other Ambulatory Visit: Payer: Self-pay

## 2021-10-14 ENCOUNTER — Ambulatory Visit (INDEPENDENT_AMBULATORY_CARE_PROVIDER_SITE_OTHER): Payer: 59 | Admitting: Internal Medicine

## 2021-10-14 ENCOUNTER — Encounter: Payer: Self-pay | Admitting: Internal Medicine

## 2021-10-14 VITALS — BP 118/86 | HR 86 | Temp 98.1°F | Ht 64.02 in | Wt 208.8 lb

## 2021-10-14 DIAGNOSIS — H6121 Impacted cerumen, right ear: Secondary | ICD-10-CM | POA: Diagnosis not present

## 2021-10-14 DIAGNOSIS — E559 Vitamin D deficiency, unspecified: Secondary | ICD-10-CM

## 2021-10-14 DIAGNOSIS — Z1231 Encounter for screening mammogram for malignant neoplasm of breast: Secondary | ICD-10-CM

## 2021-10-14 DIAGNOSIS — E2839 Other primary ovarian failure: Secondary | ICD-10-CM | POA: Diagnosis not present

## 2021-10-14 DIAGNOSIS — Z0000 Encounter for general adult medical examination without abnormal findings: Secondary | ICD-10-CM

## 2021-10-14 DIAGNOSIS — I10 Essential (primary) hypertension: Secondary | ICD-10-CM

## 2021-10-14 DIAGNOSIS — Z23 Encounter for immunization: Secondary | ICD-10-CM | POA: Diagnosis not present

## 2021-10-14 DIAGNOSIS — Z1211 Encounter for screening for malignant neoplasm of colon: Secondary | ICD-10-CM

## 2021-10-14 DIAGNOSIS — R739 Hyperglycemia, unspecified: Secondary | ICD-10-CM | POA: Diagnosis not present

## 2021-10-14 DIAGNOSIS — Z1389 Encounter for screening for other disorder: Secondary | ICD-10-CM

## 2021-10-14 LAB — COMPREHENSIVE METABOLIC PANEL
ALT: 63 U/L — ABNORMAL HIGH (ref 0–35)
AST: 42 U/L — ABNORMAL HIGH (ref 0–37)
Albumin: 4.4 g/dL (ref 3.5–5.2)
Alkaline Phosphatase: 79 U/L (ref 39–117)
BUN: 12 mg/dL (ref 6–23)
CO2: 30 mEq/L (ref 19–32)
Calcium: 9.5 mg/dL (ref 8.4–10.5)
Chloride: 100 mEq/L (ref 96–112)
Creatinine, Ser: 0.87 mg/dL (ref 0.40–1.20)
GFR: 71.64 mL/min (ref >60.00)
Glucose, Bld: 106 mg/dL — ABNORMAL HIGH (ref 70–99)
Potassium: 3.8 mEq/L (ref 3.5–5.1)
Sodium: 138 mEq/L (ref 135–145)
Total Bilirubin: 0.7 mg/dL (ref 0.2–1.2)
Total Protein: 7.3 g/dL (ref 6.0–8.3)

## 2021-10-14 LAB — CBC WITH DIFFERENTIAL/PLATELET
Basophils Absolute: 0.1 10*3/uL (ref 0.0–0.1)
Basophils Relative: 1.1 % (ref 0.0–3.0)
Eosinophils Absolute: 0.1 10*3/uL (ref 0.0–0.7)
Eosinophils Relative: 2.3 % (ref 0.0–5.0)
HCT: 45.9 % (ref 36.0–46.0)
Hemoglobin: 15.4 g/dL — ABNORMAL HIGH (ref 12.0–15.0)
Lymphocytes Relative: 36.9 % (ref 12.0–46.0)
Lymphs Abs: 2.2 10*3/uL (ref 0.7–4.0)
MCHC: 33.6 g/dL (ref 30.0–36.0)
MCV: 88.8 fl (ref 78.0–100.0)
Monocytes Absolute: 0.4 10*3/uL (ref 0.1–1.0)
Monocytes Relative: 7.5 % (ref 3.0–12.0)
Neutro Abs: 3.1 10*3/uL (ref 1.4–7.7)
Neutrophils Relative %: 52.2 % (ref 43.0–77.0)
Platelets: 252 10*3/uL (ref 150.0–400.0)
RBC: 5.18 Mil/uL — ABNORMAL HIGH (ref 3.87–5.11)
RDW: 13.4 % (ref 11.5–15.5)
WBC: 5.9 10*3/uL (ref 4.0–10.5)

## 2021-10-14 LAB — LIPID PANEL
Cholesterol: 179 mg/dL (ref 0–200)
HDL: 52.6 mg/dL (ref >39.00)
LDL Cholesterol: 101 mg/dL — ABNORMAL HIGH (ref 0–99)
NonHDL: 125.99
Total CHOL/HDL Ratio: 3
Triglycerides: 123 mg/dL (ref 0.0–149.0)
VLDL: 24.6 mg/dL (ref 0.0–40.0)

## 2021-10-14 LAB — HEMOGLOBIN A1C: Hgb A1c MFr Bld: 5.9 % (ref 4.6–6.5)

## 2021-10-14 LAB — VITAMIN D 25 HYDROXY (VIT D DEFICIENCY, FRACTURES): VITD: 34.59 ng/mL (ref 30.00–100.00)

## 2021-10-14 LAB — TSH: TSH: 5.08 u[IU]/mL (ref 0.35–5.50)

## 2021-10-14 MED ORDER — HYDROCHLOROTHIAZIDE 12.5 MG PO TABS: 12.5000 mg | ORAL_TABLET | Freq: Every morning | ORAL | 3 refills | Status: DC

## 2021-10-14 MED ORDER — SHINGRIX 50 MCG/0.5ML IM SUSR: 0.5000 mL | Freq: Once | INTRAMUSCULAR | 1 refills | Status: AC

## 2021-10-14 MED ORDER — SHINGRIX 50 MCG/0.5ML IM SUSR: 0.5000 mL | Freq: Once | INTRAMUSCULAR | 1 refills | Status: DC

## 2021-10-14 NOTE — Patient Instructions (Addendum)
MD Physician  ? ?Primary Contact Information ? ?Phone Fax E-mail Address  ?[phone] Not available [address]  ? [address]  ? [place] [address]  ?Call to schedule colonoscopy  ? ?Consider new hep B vaccine x 2 doses +  hep A vaccine ?Shingrix vaccine  ?

## 2021-10-14 NOTE — Progress Notes (Signed)
Chief Complaint  ?Patient presents with  ? Annual Exam  ? ?Annual  ?1. Htn on hctz 12.5 mg qd and losartan 50 mg qd  ?Sl elevated pt has gained wt  ?2. Due colonoscopy needs to call and schedule  mammo due 12/19/21 ? ? ?Review of Systems  ?Constitutional:  Negative for weight loss.  ?HENT:  Negative for hearing loss.   ?Eyes:  Negative for blurred vision.  ?Respiratory:  Negative for shortness of breath.   ?Cardiovascular:  Negative for chest pain.  ?Gastrointestinal:  Negative for abdominal pain and blood in stool.  ?Genitourinary:  Negative for dysuria.  ?Musculoskeletal:  Negative for falls and joint pain.  ?Skin:  Negative for rash.  ?Neurological:  Negative for headaches.  ?Psychiatric/Behavioral:  Negative for depression.   ?Past Medical History:  ?Diagnosis Date  ? Allergic rhinitis   ? Allergy   ? Breast calcification, left   ? [facility] 12/2020   ? Chicken pox   ? COVID-19   ? mid 08/16/2020 fever/congestion/loss smell/taste 48 hrs  ? GERD (gastroesophageal reflux disease)   ? Hypertension   ? ?Past Surgical History:  ?Procedure Laterality Date  ? ABDOMINAL HYSTERECTOMY  08/04/2007  ? CESAREAN SECTION  [phone], 2002  ? OOPHORECTOMY    ? ovaries removed at hysterectomy  ? TONSILLECTOMY    ? ~ age 33-9 yo  ? ?Family History  ?Problem Relation Age of Onset  ? Drug abuse Mother   ? Heart disease Mother   ? Hypertension Mother   ? Alcohol abuse Father   ? COPD Father   ? Sudden Cardiac Death Maternal Grandmother   ? Heart disease Maternal Grandmother   ? Colon cancer Neg Hx   ? Colon polyps Neg Hx   ? Esophageal cancer Neg Hx   ? Stomach cancer Neg Hx   ? Rectal cancer Neg Hx   ? ?Social History  ? ?Socioeconomic History  ? Marital status: Married  ?  Spouse name: Not on file  ? Number of children: Not on file  ? Years of education: Not on file  ? Highest education level: Not on file  ?Occupational History  ? Not on file  ?Tobacco Use  ? Smoking status: Never  ? Smokeless tobacco: Never  ?Vaping Use  ? Vaping  Use: Never used  ?Substance and Sexual Activity  ? Alcohol use: No  ?  Alcohol/week: 0.0 standard drinks  ? Drug use: No  ? Sexual activity: Yes  ?  Partners: Male  ?  Birth control/protection: Post-menopausal  ?Other Topics Concern  ? Not on file  ?Social History Narrative  ? Married   ? Works [employer] for breast cancer pt   ? Likes to bike   ? 2 daughters   ? grandaughter born Arna Medici 07/11/20 in [place]  ? ?Social Determinants of Health  ? ?Financial Resource Strain: Not on file  ?Food Insecurity: Not on file  ?Transportation Needs: Not on file  ?Physical Activity: Not on file  ?Stress: Not on file  ?Social Connections: Not on file  ?Intimate Partner Violence: Not on file  ? ?Current Meds  ?Medication Sig  ? cetirizine (ZYRTEC) 10 MG tablet Take 10 mg by mouth daily.  ? losartan (COZAAR) 50 MG tablet TAKE 1 TABLET BY MOUTH IN THE MORNING AND AT BEDTIME.  ? metroNIDAZOLE (METROGEL) 1 % gel Apply topically daily.  ? naproxen sodium (ALEVE) 220 MG tablet Take 220 mg by mouth.  ? [DISCONTINUED] hydrochlorothiazide (HYDRODIURIL) 12.5  MG tablet TAKE 1 TABLET BY MOUTH DAILY IN THE MORNING  ? [DISCONTINUED] Zoster Vaccine Adjuvanted [facility]) injection Inject 0.5 mLs into the muscle once for 1 dose. 2 doses  ? ?No Known Allergies ?Recent Results (from the past 2160 hour(s))  ?[facility] MAMMOGRAPHY     Status: None  ? Collection Time: 08/05/21 12:00 AM  ?Result Value Ref Range  ? [facility] Mammogram 0-4 Bi-Rad 0-4 Bi-Rad, Self Reported Normal  ?  Comment: [facility] negative   ? ?Objective  ?Body mass index is 35.82 kg/m?. ?Wt Readings from Last 3 Encounters:  ?10/14/21 208 lb 12.8 oz (94.7 kg)  ?01/29/21 205 lb (93 kg)  ?10/08/20 203 lb (92.1 kg)  ? ?Temp Readings from Last 3 Encounters:  ?10/14/21 98.1 ?F (36.7 ?C) (Oral)  ?10/08/20 97.6 ?F (36.4 ?C)  ?11/23/19 98.1 ?F (36.7 ?C) (Temporal)  ? ?BP Readings from Last 3 Encounters:  ?10/14/21 118/86  ?10/08/20 118/82  ?11/23/19 126/76  ? ?Pulse Readings from Last 3 Encounters:   ?10/14/21 86  ?10/08/20 92  ?11/23/19 74  ? ? ?Physical Exam ?Vitals and nursing note reviewed.  ?Constitutional:   ?   Appearance: Normal appearance. She is well-developed and well-groomed.  ?HENT:  ?   Head: Normocephalic and atraumatic.  ?   Right Ear: There is impacted cerumen.  ?Eyes:  ?   Conjunctiva/sclera: Conjunctivae normal.  ?   Pupils: Pupils are equal, round, and reactive to light.  ?Cardiovascular:  ?   Rate and Rhythm: Normal rate and regular rhythm.  ?   Heart sounds: Normal heart sounds. No murmur heard. ?Pulmonary:  ?   Effort: Pulmonary effort is normal.  ?   Breath sounds: Normal breath sounds.  ?Abdominal:  ?   General: Abdomen is flat. Bowel sounds are normal.  ?   Tenderness: There is no abdominal tenderness.  ?Musculoskeletal:     ?   General: No tenderness.  ?Skin: ?   General: Skin is warm and dry.  ?Neurological:  ?   General: No focal deficit present.  ?   Mental Status: She is alert and oriented to person, place, and time. Mental status is at baseline.  ?   Cranial Nerves: Cranial nerves 2-12 are intact.  ?   Motor: Motor function is intact.  ?   Coordination: Coordination is intact.  ?   Gait: Gait is intact.  ?Psychiatric:     ?   Attention and Perception: Attention and perception normal.     ?   Mood and Affect: Mood and affect normal.     ?   Speech: Speech normal.     ?   Behavior: Behavior normal. Behavior is cooperative.     ?   Thought Content: Thought content normal.     ?   Cognition and Memory: Cognition and memory normal.     ?   Judgment: Judgment normal.  ? ? ?Assessment  ?Plan  ?Annual physical exam - Plan: Comprehensive metabolic panel, Lipid panel, CBC with Differential/Platelet, TSH, Urinalysis, Routine w reflex microscopic, Vitamin D (25 hydroxy), Hemoglobin A1c ?See below  ? ? ?Vitamin D deficiency - Plan: Vitamin D (25 hydroxy) ? ?Essential hypertension - Plan: hydrochlorothiazide (HYDRODIURIL) 12.5 MG tablet losartan 50 mg qd consider increase to 100 if BP not at  goal sl elevated today  ? ? ?Impacted cerumen of right ear  ?Consented and tolerated ear wax removal right currette all wax removed  ? ?HM ?Fasting labs today ?Declines flu shot  ?  Tdap  10/08/20  ?2/2/ covid vaccines declines 3rd dose had covid 08/2020 ?Consider check hep A/B in future ?Given info shingrix  declined in past for now will consider in future and Rx  rx today ?  ?mammo neg5/7/21 ordered mammogram 2022 [facility] 12/19/20 cluster round punctate round calcifications left breast benign f/u 6 months rec [facility]  ?08/2021 abnormal left but normal repeat  ?Ordered 12/19/21 [facility]  ? ?Consider DEXA age 61 ordered ?  ?Get pap [facility] records 04/2017 s/p total hysterectomy for ovarian cyst but per pt had pap 04/2017 cervix out never abnormal  ?-saw [facility] 02/23/17 notes no pap attached will request  ?-pt will call and schedule f/u [facility] disc Levin ErpAlicia Copeland or Dr. Bonney AidStaebler ?No pap needed  ?  ?Colonoscopy hold until 01/2021 referred [facility] ?Ordered again  ?  ?rec healthy diet and exercise  ?  ?Dermatology 10/2021  ? ?Provider: Dr. French Anaracy [facility]Lean-[facility]ocuz[facility]  ?

## 2021-10-15 LAB — URINALYSIS, ROUTINE W REFLEX MICROSCOPIC
Bilirubin Urine: NEGATIVE
Glucose, UA: NEGATIVE
Hgb urine dipstick: NEGATIVE
Ketones, ur: NEGATIVE
Leukocytes,Ua: NEGATIVE
Nitrite: NEGATIVE
Protein, ur: NEGATIVE
Specific Gravity, Urine: 1.017 (ref 1.001–1.035)
pH: 7 (ref 5.0–8.0)

## 2021-10-21 ENCOUNTER — Other Ambulatory Visit: Payer: Self-pay | Admitting: Internal Medicine

## 2021-11-15 ENCOUNTER — Other Ambulatory Visit: Payer: Self-pay | Admitting: Internal Medicine

## 2021-12-19 LAB — HM MAMMOGRAPHY

## 2021-12-25 ENCOUNTER — Encounter: Payer: Self-pay | Admitting: Internal Medicine

## 2022-01-12 ENCOUNTER — Other Ambulatory Visit: Payer: Self-pay | Admitting: Internal Medicine

## 2022-04-14 ENCOUNTER — Other Ambulatory Visit: Payer: Self-pay | Admitting: Internal Medicine

## 2022-04-21 ENCOUNTER — Ambulatory Visit: Payer: 59 | Admitting: Internal Medicine

## 2022-08-21 ENCOUNTER — Other Ambulatory Visit: Payer: Self-pay

## 2022-08-21 MED ORDER — HYDROCHLOROTHIAZIDE 12.5 MG PO CAPS: 12.5000 mg | ORAL_CAPSULE | Freq: Every morning | ORAL | 0 refills | Status: AC

## 2022-09-18 ENCOUNTER — Other Ambulatory Visit: Payer: Self-pay | Admitting: Family

## 2022-09-18 DIAGNOSIS — I10 Essential (primary) hypertension: Secondary | ICD-10-CM

## 2022-09-22 ENCOUNTER — Other Ambulatory Visit: Payer: Self-pay | Admitting: Family

## 2022-10-05 ENCOUNTER — Telehealth: Payer: Self-pay | Admitting: Family

## 2022-10-05 DIAGNOSIS — I10 Essential (primary) hypertension: Secondary | ICD-10-CM

## 2022-10-05 MED ORDER — HYDROCHLOROTHIAZIDE 12.5 MG PO TABS: 12.5000 mg | ORAL_TABLET | Freq: Every morning | ORAL | 0 refills | Status: AC

## 2022-10-05 NOTE — Addendum Note (Signed)
Addended by: Burnard Hawthorne on: 10/05/2022 03:57 PM   Modules accepted: Orders

## 2022-10-05 NOTE — Telephone Encounter (Signed)
Hctz Refilled provided for 30 days

## 2022-10-05 NOTE — Telephone Encounter (Signed)
This message is being sent to Arnett because she is Doc of the day. Patient came into the office requesting enough hydrochlorothiazide (MICROZIDE) 12.5 MG capsule until she sees her new Provider on 10/27/2022 with Dr Tressia Miners with [facility]. Pharmacy is [facility].

## 2022-10-15 ENCOUNTER — Other Ambulatory Visit: Payer: Self-pay | Admitting: Family

## 2022-10-15 DIAGNOSIS — I10 Essential (primary) hypertension: Secondary | ICD-10-CM

## 2022-10-20 ENCOUNTER — Encounter: Payer: 59 | Admitting: Internal Medicine

## 2022-10-29 ENCOUNTER — Other Ambulatory Visit: Payer: Self-pay

## 2022-10-29 DIAGNOSIS — R140 Abdominal distension (gaseous): Secondary | ICD-10-CM

## 2022-10-29 DIAGNOSIS — R1084 Generalized abdominal pain: Secondary | ICD-10-CM

## 2022-10-29 DIAGNOSIS — Z0000 Encounter for general adult medical examination without abnormal findings: Secondary | ICD-10-CM

## 2022-11-11 ENCOUNTER — Ambulatory Visit
Admission: RE | Admit: 2022-11-11 | Discharge: 2022-11-11 | Disposition: A | Discharge status: Home / Self Care | Payer: 59 | Source: Ambulatory Visit | Attending: Internal Medicine | Admitting: Internal Medicine

## 2022-11-11 DIAGNOSIS — R1084 Generalized abdominal pain: Secondary | ICD-10-CM | POA: Diagnosis present

## 2022-11-11 DIAGNOSIS — Z0000 Encounter for general adult medical examination without abnormal findings: Secondary | ICD-10-CM | POA: Diagnosis present

## 2022-11-11 DIAGNOSIS — R140 Abdominal distension (gaseous): Secondary | ICD-10-CM | POA: Diagnosis present

## 2022-11-11 MED ORDER — IOHEXOL 300 MG/ML  SOLN
100.0000 mL | Freq: Once | INTRAMUSCULAR | Status: AC | PRN
  Administered 2022-11-11: 100 mL via INTRAVENOUS

## 2023-01-18 ENCOUNTER — Encounter (INDEPENDENT_AMBULATORY_CARE_PROVIDER_SITE_OTHER): Payer: Self-pay | Admitting: Vascular Surgery

## 2023-01-18 ENCOUNTER — Ambulatory Visit (INDEPENDENT_AMBULATORY_CARE_PROVIDER_SITE_OTHER): Payer: 59 | Admitting: Vascular Surgery

## 2023-01-18 VITALS — BP 131/90 | HR 93 | Resp 16 | Ht 63.0 in | Wt 203.8 lb

## 2023-01-18 DIAGNOSIS — I890 Lymphedema, not elsewhere classified: Secondary | ICD-10-CM

## 2023-01-18 DIAGNOSIS — I872 Venous insufficiency (chronic) (peripheral): Secondary | ICD-10-CM

## 2023-01-18 DIAGNOSIS — I10 Essential (primary) hypertension: Secondary | ICD-10-CM

## 2023-01-18 NOTE — Progress Notes (Signed)
MRN : [id number]  Margaret Carroll is a 62 y.o. (19-Jul-1961) female who presents with chief complaint of legs hurt and swell.  History of Present Illness:   Patient is seen for evaluation of leg swelling. The patient first noticed the swelling remotely but is now concerned because of a significant increase in the overall edema. The swelling isn't associated with significant pain.  She notes it is particularly prominent on the tops of her feet. The patient notes that in the morning the legs are improved but they steadily worsened throughout the course of the day. Elevation seems to make the swelling of the legs better, dependency makes them much worse.   There is no history of ulcerations associated with the swelling.   The patient denies any recent changes in their medications.  The patient has not been wearing graduated compression.  The patient has no had any past angiography, interventions or vascular surgery.  The patient denies a history of DVT or PE. There is no prior history of phlebitis. There is no history of primary lymphedema.  There is no history of radiation treatment to the groin or pelvis No history of malignancies. No history of trauma or groin or pelvic surgery. No history of foreign travel or parasitic infections area   Current Meds  Medication Sig   cetirizine (ZYRTEC) 10 MG tablet Take 10 mg by mouth daily.   hydrochlorothiazide (MICROZIDE) 12.5 MG capsule Take 1 capsule (12.5 mg total) by mouth every morning.   losartan (COZAAR) 50 MG tablet TAKE 1 TABLET BY MOUTH IN THE MORNING AND AT BEDTIME.   metroNIDAZOLE (METROGEL) 1 % gel Apply topically daily.   Multiple Vitamin (MULTIVITAMIN) capsule Take 1 capsule by mouth daily.   naproxen sodium (ALEVE) 220 MG tablet Take 220 mg by mouth.    Past Medical History:  Diagnosis Date   Allergic rhinitis    Allergy    Breast calcification, left    [facility] 12/2020    Chicken pox    COVID-19    mid 08/16/2020  fever/congestion/loss smell/taste 48 hrs   GERD (gastroesophageal reflux disease)    Hypertension     Past Surgical History:  Procedure Laterality Date   ABDOMINAL HYSTERECTOMY  08/04/2007   CESAREAN SECTION  [id number], 2002   OOPHORECTOMY     ovaries removed at hysterectomy   TONSILLECTOMY     ~ age 63-9 yo    Social History Social History   Tobacco Use   Smoking status: Never   Smokeless tobacco: Never  Vaping Use   Vaping Use: Never used  Substance Use Topics   Alcohol use: No    Alcohol/week: 0.0 standard drinks of alcohol   Drug use: No    Family History Family History  Problem Relation Age of Onset   Drug abuse Mother    Heart disease Mother    Hypertension Mother    Alcohol abuse Father    COPD Father    Sudden Cardiac Death Maternal Grandmother    Heart disease Maternal Grandmother    Colon cancer Neg Hx    Colon polyps Neg Hx    Esophageal cancer Neg Hx    Stomach cancer Neg Hx    Rectal cancer Neg Hx     No Known Allergies   REVIEW OF SYSTEMS (Negative unless checked)  Constitutional: [] Weight loss  [] Fever  [] Chills Cardiac: [] Chest pain   [] Chest pressure   [] Palpitations   [] Shortness of breath when  laying flat   [] Shortness of breath with exertion. Vascular:  [] Pain in legs with walking   [x] Pain in legs at rest  [] History of DVT   [] Phlebitis   [x] Swelling in legs   [] Varicose veins   [] Non-healing ulcers Pulmonary:   [] Uses home oxygen   [] Productive cough   [] Hemoptysis   [] Wheeze  [] COPD   [] Asthma Neurologic:  [] Dizziness   [] Seizures   [] History of stroke   [] History of TIA  [] Aphasia   [] Vissual changes   [] Weakness or numbness in arm   [] Weakness or numbness in leg Musculoskeletal:   [] Joint swelling   [] Joint pain   [] Low back pain Hematologic:  [] Easy bruising  [] Easy bleeding   [] Hypercoagulable state   [] Anemic Gastrointestinal:  [] Diarrhea   [] Vomiting  [] Gastroesophageal reflux/heartburn   [] Difficulty  swallowing. Genitourinary:  [] Chronic kidney disease   [] Difficult urination  [] Frequent urination   [] Blood in urine Skin:  [] Rashes   [] Ulcers  Psychological:  [] History of anxiety   []  History of major depression.  Physical Examination  Vitals:   01/18/23 0940  BP: (!) 131/90  Pulse: 93  Resp: 16  Weight: 203 lb 12.8 oz (92.4 kg)  Height: 5\' 3"  (1.6 m)   Body mass index is 36.1 kg/m. Gen: WD/WN, NAD Head: [place]/AT, No temporalis wasting.  Ear/Nose/Throat: Hearing grossly intact, nares w/o erythema or drainage, pinna without lesions Eyes: PER, EOMI, sclera nonicteric.  Neck: Supple, no gross masses.  No JVD.  Pulmonary:  Good air movement, no audible wheezing, no use of accessory muscles.  Cardiac: RRR, precordium not hyperdynamic. Vascular:  scattered varicosities present bilaterally.  Moderate venous stasis changes to the legs bilaterally.  2+ soft pitting edema. CEAP [id number]   Vessel Right Left  Radial Palpable Palpable  Gastrointestinal: soft, non-distended. No guarding/no peritoneal signs.  Musculoskeletal: M/S 5/5 throughout.  No deformity.  Neurologic: CN 2-12 intact. Pain and light touch intact in extremities.  Symmetrical.  Speech is fluent. Motor exam as listed above. Psychiatric: Judgment intact, Mood & affect appropriate for pt's clinical situation. Dermatologic: Venous rashes no ulcers noted.  No changes consistent with cellulitis. Lymph : No lichenification or skin changes of chronic lymphedema.  CBC Lab Results  Component Value Date   WBC 5.9 10/14/2021   HGB 15.4 (H) 10/14/2021   HCT 45.9 10/14/2021   MCV 88.8 10/14/2021   PLT 252.0 10/14/2021    BMET    Component Value Date/Time   NA 138 10/14/2021 0924   NA 140 10/13/2014 0738   K 3.8 10/14/2021 0924   CL 100 10/14/2021 0924   CO2 30 10/14/2021 0924   GLUCOSE 106 (H) 10/14/2021 0924   BUN 12 10/14/2021 0924   BUN 11 10/13/2014 0738   CREATININE 0.87 10/14/2021 0924   CALCIUM 9.5 10/14/2021  0924   CrCl cannot be calculated (Patient's most recent lab result is older than the maximum 21 days allowed.).  COAG No results found for: "INR", "PROTIME"  Radiology No results found.   Assessment/Plan 1. Lymphedema Recommend:  No surgery or intervention at this point in time.    I have reviewed my discussion with the patient regarding venous insufficiency and secondary lymph edema and why it  causes symptoms. I have discussed with the patient the chronic skin changes that accompany these problems and the long term sequela such as ulceration and infection.  Patient will continue wearing graduated compression on a daily basis a prescription, if needed, was given to the patient to  keep this updated. The patient will  put the compression on first thing in the morning and removing them in the evening. The patient is instructed specifically not to sleep in the compression.  In addition, behavioral modification including elevation during the day will be continued.  Diet and salt restriction will also be helpful.  The patient will follow up in 12 months to reassess the degree of swelling and the control that graduated compression is offering.   The patient can be assessed for a Lymph Pump at that time.  However, at this time the patient states they are satisfied with the control compression and elevation is yielding.    2. Chronic venous insufficiency Recommend:  No surgery or intervention at this point in time.    I have reviewed my discussion with the patient regarding venous insufficiency and secondary lymph edema and why it  causes symptoms. I have discussed with the patient the chronic skin changes that accompany these problems and the long term sequela such as ulceration and infection.  Patient will continue wearing graduated compression on a daily basis a prescription, if needed, was given to the patient to keep this updated. The patient will  put the compression on first thing in the  morning and removing them in the evening. The patient is instructed specifically not to sleep in the compression.  In addition, behavioral modification including elevation during the day will be continued.  Diet and salt restriction will also be helpful.  The patient will follow up in 12 months to reassess the degree of swelling and the control that graduated compression is offering.   The patient can be assessed for a Lymph Pump at that time.  However, at this time the patient states they are satisfied with the control compression and elevation is yielding.   3. Essential hypertension Continue antihypertensive medications as already ordered, these medications have been reviewed and there are no changes at this time.    Levora Dredge, MD  01/18/2023 9:42 AM

## 2023-01-23 ENCOUNTER — Encounter (INDEPENDENT_AMBULATORY_CARE_PROVIDER_SITE_OTHER): Payer: Self-pay | Admitting: Vascular Surgery

## 2023-01-23 DIAGNOSIS — I872 Venous insufficiency (chronic) (peripheral): Secondary | ICD-10-CM | POA: Insufficient documentation

## 2023-01-23 DIAGNOSIS — I890 Lymphedema, not elsewhere classified: Secondary | ICD-10-CM | POA: Insufficient documentation

## 2023-12-21 ENCOUNTER — Encounter (INDEPENDENT_AMBULATORY_CARE_PROVIDER_SITE_OTHER): Payer: Self-pay

## 2024-01-17 ENCOUNTER — Ambulatory Visit (INDEPENDENT_AMBULATORY_CARE_PROVIDER_SITE_OTHER): Payer: 59 | Admitting: Vascular Surgery

## 2024-04-21 ENCOUNTER — Encounter

## 2024-05-19 ENCOUNTER — Ambulatory Visit

## 2024-05-19 DIAGNOSIS — Z1211 Encounter for screening for malignant neoplasm of colon: Secondary | ICD-10-CM | POA: Diagnosis present

## 2024-05-19 DIAGNOSIS — D124 Benign neoplasm of descending colon: Secondary | ICD-10-CM | POA: Diagnosis not present
